# Patient Record
Sex: Male | Born: 1970 | Race: White | Hispanic: No | Marital: Single | Smoking: Never smoker
Health system: Southern US, Community
[De-identification: ages and names within clinical notes are randomized; demographics above are authoritative.]

## PROBLEM LIST (undated history)

## (undated) DIAGNOSIS — K219 Gastro-esophageal reflux disease without esophagitis: Secondary | ICD-10-CM

## (undated) DIAGNOSIS — K449 Diaphragmatic hernia without obstruction or gangrene: Secondary | ICD-10-CM

## (undated) DIAGNOSIS — K222 Esophageal obstruction: Secondary | ICD-10-CM

## (undated) DIAGNOSIS — F819 Developmental disorder of scholastic skills, unspecified: Secondary | ICD-10-CM

## (undated) HISTORY — DX: Esophageal obstruction: K22.2

## (undated) HISTORY — DX: Gastro-esophageal reflux disease without esophagitis: K21.9

## (undated) HISTORY — PX: DENTAL SURGERY: SHX609

## (undated) HISTORY — DX: Diaphragmatic hernia without obstruction or gangrene: K44.9

---

## 2017-06-08 ENCOUNTER — Emergency Department (HOSPITAL_COMMUNITY)
Admission: EM | Admit: 2017-06-08 | Discharge: 2017-06-08 | Disposition: A | Discharge status: Home / Self Care | Payer: BC Managed Care – PPO | Source: Home / Self Care | Attending: Emergency Medicine | Admitting: Emergency Medicine

## 2017-06-08 ENCOUNTER — Observation Stay (HOSPITAL_COMMUNITY): Payer: BC Managed Care – PPO

## 2017-06-08 ENCOUNTER — Encounter (HOSPITAL_COMMUNITY): Payer: Self-pay

## 2017-06-08 ENCOUNTER — Inpatient Hospital Stay (HOSPITAL_COMMUNITY)
Admission: EM | Admit: 2017-06-08 | Discharge: 2017-06-10 | DRG: 392 | Disposition: A | Discharge status: Home / Self Care | Payer: BC Managed Care – PPO | Attending: Internal Medicine | Admitting: Internal Medicine

## 2017-06-08 DIAGNOSIS — J069 Acute upper respiratory infection, unspecified: Secondary | ICD-10-CM | POA: Diagnosis present

## 2017-06-08 DIAGNOSIS — D62 Acute posthemorrhagic anemia: Secondary | ICD-10-CM

## 2017-06-08 DIAGNOSIS — K920 Hematemesis: Secondary | ICD-10-CM

## 2017-06-08 DIAGNOSIS — K3189 Other diseases of stomach and duodenum: Secondary | ICD-10-CM | POA: Diagnosis present

## 2017-06-08 DIAGNOSIS — K922 Gastrointestinal hemorrhage, unspecified: Secondary | ICD-10-CM | POA: Diagnosis present

## 2017-06-08 DIAGNOSIS — B348 Other viral infections of unspecified site: Secondary | ICD-10-CM

## 2017-06-08 DIAGNOSIS — K210 Gastro-esophageal reflux disease with esophagitis: Secondary | ICD-10-CM | POA: Diagnosis not present

## 2017-06-08 DIAGNOSIS — R059 Cough, unspecified: Secondary | ICD-10-CM

## 2017-06-08 DIAGNOSIS — R05 Cough: Secondary | ICD-10-CM

## 2017-06-08 DIAGNOSIS — R0989 Other specified symptoms and signs involving the circulatory and respiratory systems: Secondary | ICD-10-CM | POA: Insufficient documentation

## 2017-06-08 DIAGNOSIS — K222 Esophageal obstruction: Secondary | ICD-10-CM | POA: Diagnosis present

## 2017-06-08 DIAGNOSIS — B9789 Other viral agents as the cause of diseases classified elsewhere: Secondary | ICD-10-CM | POA: Diagnosis present

## 2017-06-08 DIAGNOSIS — K921 Melena: Secondary | ICD-10-CM | POA: Diagnosis present

## 2017-06-08 DIAGNOSIS — R55 Syncope and collapse: Secondary | ICD-10-CM | POA: Diagnosis present

## 2017-06-08 DIAGNOSIS — K449 Diaphragmatic hernia without obstruction or gangrene: Secondary | ICD-10-CM | POA: Diagnosis present

## 2017-06-08 DIAGNOSIS — D649 Anemia, unspecified: Secondary | ICD-10-CM | POA: Diagnosis not present

## 2017-06-08 DIAGNOSIS — F819 Developmental disorder of scholastic skills, unspecified: Secondary | ICD-10-CM | POA: Diagnosis present

## 2017-06-08 HISTORY — DX: Developmental disorder of scholastic skills, unspecified: F81.9

## 2017-06-08 LAB — TYPE AND SCREEN
ABO/RH(D): O POS
Antibody Screen: NEGATIVE

## 2017-06-08 LAB — CBC
HCT: 29.3 % — ABNORMAL LOW (ref 39.0–52.0)
HCT: 26.3 % — ABNORMAL LOW (ref 39.0–52.0)
HEMATOCRIT: 31 % — AB (ref 39.0–52.0)
HEMOGLOBIN: 10 g/dL — AB (ref 13.0–17.0)
HEMOGLOBIN: 8.8 g/dL — AB (ref 13.0–17.0)
Hemoglobin: 10.3 g/dL — ABNORMAL LOW (ref 13.0–17.0)
MCH: 31.4 pg (ref 26.0–34.0)
MCH: 30.4 pg (ref 26.0–34.0)
MCH: 30.4 pg (ref 26.0–34.0)
MCHC: 34.1 g/dL (ref 30.0–36.0)
MCHC: 33.2 g/dL (ref 30.0–36.0)
MCHC: 33.5 g/dL (ref 30.0–36.0)
MCV: 92.1 fL (ref 78.0–100.0)
MCV: 91.4 fL (ref 78.0–100.0)
MCV: 91 fL (ref 78.0–100.0)
PLATELETS: 224 10*3/uL (ref 150–400)
PLATELETS: 267 10*3/uL (ref 150–400)
PLATELETS: 287 10*3/uL (ref 150–400)
RBC: 3.18 MIL/uL — AB (ref 4.22–5.81)
RBC: 3.39 MIL/uL — ABNORMAL LOW (ref 4.22–5.81)
RBC: 2.89 MIL/uL — AB (ref 4.22–5.81)
RDW: 12.7 % (ref 11.5–15.5)
RDW: 12.5 % (ref 11.5–15.5)
RDW: 12.5 % (ref 11.5–15.5)
WBC: 12.9 10*3/uL — AB (ref 4.0–10.5)
WBC: 10.2 10*3/uL (ref 4.0–10.5)
WBC: 13.5 10*3/uL — ABNORMAL HIGH (ref 4.0–10.5)

## 2017-06-08 LAB — COMPREHENSIVE METABOLIC PANEL
ALT: 18 U/L (ref 17–63)
ALT: 18 U/L (ref 17–63)
AST: 19 U/L (ref 15–41)
AST: 20 U/L (ref 15–41)
Albumin: 2.9 g/dL — ABNORMAL LOW (ref 3.5–5.0)
Albumin: 3.4 g/dL — ABNORMAL LOW (ref 3.5–5.0)
Alkaline Phosphatase: 46 U/L (ref 38–126)
Alkaline Phosphatase: 42 U/L (ref 38–126)
Anion gap: 8 (ref 5–15)
Anion gap: 8 (ref 5–15)
BUN: 35 mg/dL — AB (ref 6–20)
BUN: 42 mg/dL — ABNORMAL HIGH (ref 6–20)
CHLORIDE: 109 mmol/L (ref 101–111)
CO2: 22 mmol/L (ref 22–32)
CO2: 21 mmol/L — ABNORMAL LOW (ref 22–32)
CREATININE: 0.86 mg/dL (ref 0.61–1.24)
CREATININE: 0.7 mg/dL (ref 0.61–1.24)
Calcium: 7.7 mg/dL — ABNORMAL LOW (ref 8.9–10.3)
Calcium: 8.4 mg/dL — ABNORMAL LOW (ref 8.9–10.3)
Chloride: 108 mmol/L (ref 101–111)
GFR calc Af Amer: >60 mL/min (ref >60)
Glucose, Bld: 126 mg/dL — ABNORMAL HIGH (ref 65–99)
Glucose, Bld: 125 mg/dL — ABNORMAL HIGH (ref 65–99)
POTASSIUM: 4 mmol/L (ref 3.5–5.1)
Potassium: 3.8 mmol/L (ref 3.5–5.1)
Sodium: 138 mmol/L (ref 135–145)
Sodium: 138 mmol/L (ref 135–145)
TOTAL PROTEIN: 4.7 g/dL — AB (ref 6.5–8.1)
TOTAL PROTEIN: 5.6 g/dL — AB (ref 6.5–8.1)
Total Bilirubin: 0.7 mg/dL (ref 0.3–1.2)
Total Bilirubin: 1.2 mg/dL (ref 0.3–1.2)

## 2017-06-08 LAB — CBG MONITORING, ED: Glucose-Capillary: 115 mg/dL — ABNORMAL HIGH (ref 65–99)

## 2017-06-08 LAB — URINALYSIS, ROUTINE W REFLEX MICROSCOPIC
BILIRUBIN URINE: NEGATIVE
Bacteria, UA: NONE SEEN
GLUCOSE, UA: NEGATIVE mg/dL
KETONES UR: 5 mg/dL — AB
LEUKOCYTES UA: NEGATIVE
Nitrite: NEGATIVE
PH: 7 (ref 5.0–8.0)
Protein, ur: NEGATIVE mg/dL
SQUAMOUS EPITHELIAL / LPF: NONE SEEN
Specific Gravity, Urine: 1.018 (ref 1.005–1.030)

## 2017-06-08 LAB — PROTIME-INR
INR: 1.15
PROTHROMBIN TIME: 14.6 s (ref 11.4–15.2)

## 2017-06-08 LAB — LIPASE, BLOOD: LIPASE: 35 U/L (ref 11–51)

## 2017-06-08 LAB — I-STAT TROPONIN, ED: TROPONIN I, POC: 0.01 ng/mL (ref 0.00–0.08)

## 2017-06-08 LAB — PROCALCITONIN: Procalcitonin: <0.1 ng/mL

## 2017-06-08 LAB — LACTIC ACID, PLASMA: LACTIC ACID, VENOUS: 0.7 mmol/L (ref 0.5–1.9)

## 2017-06-08 LAB — APTT: aPTT: 27 seconds (ref 24–36)

## 2017-06-08 LAB — POC OCCULT BLOOD, ED: Fecal Occult Bld: POSITIVE — AB

## 2017-06-08 LAB — ABO/RH: ABO/RH(D): O POS

## 2017-06-08 MED ORDER — ONDANSETRON HCL 4 MG PO TABS: 4.0000 mg | ORAL_TABLET | Freq: Four times a day (QID) | ORAL | Status: DC | PRN

## 2017-06-08 MED ORDER — ACETAMINOPHEN 325 MG PO TABS: 650.0000 mg | ORAL_TABLET | Freq: Four times a day (QID) | ORAL | Status: DC | PRN

## 2017-06-08 MED ORDER — ONDANSETRON HCL 4 MG/2ML IJ SOLN: 4.0000 mg | Freq: Four times a day (QID) | INTRAMUSCULAR | Status: DC | PRN

## 2017-06-08 MED ORDER — SODIUM CHLORIDE 0.9 % IV BOLUS (SEPSIS)
1000.0000 mL | Freq: Once | INTRAVENOUS | Status: AC
  Administered 2017-06-08: 1000 mL via INTRAVENOUS

## 2017-06-08 MED ORDER — PANTOPRAZOLE SODIUM 40 MG IV SOLR
8.0000 mg/h | INTRAVENOUS | Status: DC
  Administered 2017-06-08 – 2017-06-10 (×4): 8 mg/h via INTRAVENOUS
  Filled 2017-06-08 (×8): qty 80

## 2017-06-08 MED ORDER — PANTOPRAZOLE SODIUM 40 MG IV SOLR
40.0000 mg | Freq: Once | INTRAVENOUS | Status: AC
  Administered 2017-06-08: 40 mg via INTRAVENOUS
  Filled 2017-06-08: qty 40

## 2017-06-08 MED ORDER — ZOLPIDEM TARTRATE 5 MG PO TABS: 5.0000 mg | ORAL_TABLET | Freq: Every evening | ORAL | Status: DC | PRN

## 2017-06-08 MED ORDER — SODIUM CHLORIDE 0.9 % IV SOLN
1000.0000 mL | INTRAVENOUS | Status: DC
  Administered 2017-06-08 – 2017-06-09 (×2): 1000 mL via INTRAVENOUS

## 2017-06-08 MED ORDER — DM-GUAIFENESIN ER 30-600 MG PO TB12: 1.0000 | ORAL_TABLET | Freq: Two times a day (BID) | ORAL | Status: DC | PRN

## 2017-06-08 MED ORDER — ACETAMINOPHEN 650 MG RE SUPP: 650.0000 mg | Freq: Four times a day (QID) | RECTAL | Status: DC | PRN

## 2017-06-08 NOTE — ED Notes (Signed)
Pt.walked well with ambulating no dizziness .no assit just stand by

## 2017-06-08 NOTE — ED Triage Notes (Signed)
Pt arrived [facility] from home where he had an unwitnessed syncopal episode but per EMS family heard him and found him to be pal and diaphoretic. EMS states no change in orthostatics, he had a systolic pressure in the 90's initially with weak radial pulses. EMS administered 1500NS last pressure 106/70

## 2017-06-08 NOTE — ED Provider Notes (Signed)
[facility] Provider Note   CSN: [id number] Arrival date & time: 06/08/17  1611     History   Chief Complaint No chief complaint on file.   HPI Fernando Griffin is a 46 y.o. male who presents emergency department for chief complaint of hematemesis and melena. The patient seems to have some cognitive disability and most of the history is given by his mother with whom he lives however she does not offer his diagnosis to me. He was seen early this morning for an episode of syncope. He has been having cold and URI symptoms for about one week and was given a flu shot at which time his symptoms worsened. He has been taking a Tylenol Cold and flu every 4 hours during the day and night as directed. The patient was diagnosis morning with vasovagal syncope and given fluid rehydration. He went home and went to bed. Around 11 AM the patient states that he woke up and had very bad as belly cramping and pain followed by an episode of black tarry diarrhea. He went back to bed and around 1:30 PM woke up and asked his mother for some cereal with bananas. She states that she fed him when he is sitting in the recliner he suddenly became ill and started vomiting mucus mixed with black coffee ground emesis. He denies any current nausea or abdominal pain. He does not have a history of chronic abdominal pain.  HPI  History reviewed. No pertinent past medical history.  There are no active problems to display for this patient.   History reviewed. No pertinent surgical history.     Home Medications    Prior to Admission medications   Medication Sig Start Date End Date Taking? Authorizing Provider  Pseudoeph-Doxylamine-DM-APAP (TYLENOL COLD/FLU SEVERE NIGHT PO) Take 2 tablets by mouth at bedtime.   Yes [provider]    Family History No family history on file.  Social History Social History  Substance Use Topics  . Smoking status: Not on file  . Smokeless tobacco: Not on file  .  Alcohol use Not on file     Allergies   Patient has no known allergies.   Review of Systems Review of Systems  Ten systems reviewed and are negative for acute change, except as noted in the HPI.   Physical Exam Updated Vital Signs BP 130/75 (BP Location: Left Arm)   Pulse 97   Temp 98.3 F (36.8 C) (Oral)   Resp 18   Ht  (1.956 m)   SpO2 100%   BMI 18.97 kg/m   Physical Exam  Constitutional: He is oriented to person, place, and time. He appears well-developed and well-nourished. No distress.  HENT:  Head: Normocephalic and atraumatic.  Eyes: Conjunctivae are normal. No scleral icterus.  Neck: Normal range of motion. Neck supple.  Cardiovascular: Normal rate, regular rhythm and normal heart sounds.   Pulmonary/Chest: Effort normal and breath sounds normal. No respiratory distress.  Abdominal: Soft. He exhibits no distension. There is no tenderness.  Genitourinary:  Genitourinary Comments: Digital Rectal Exam Normal rectal tone, no masses or fissures Gluteal cleft covered and melanotic stool  Musculoskeletal: He exhibits no edema.  Neurological: He is alert and oriented to person, place, and time.  Skin: Skin is warm and dry. He is not diaphoretic.  Psychiatric: His behavior is normal.  Nursing note and vitals reviewed.    ED Treatments / Results  Labs (all labs ordered are listed, but only abnormal results are displayed)  Labs Reviewed  COMPREHENSIVE METABOLIC PANEL - Abnormal; Notable for the following:       Result Value   CO2 21 (*)    Glucose, Bld 125 (*)    BUN 42 (*)    Calcium 8.4 (*)    Total Protein 5.6 (*)    Albumin 3.4 (*)    All other components within normal limits  CBC - Abnormal; Notable for the following:    RBC 3.39 (*)    Hemoglobin 10.3 (*)    HCT 31.0 (*)    All other components within normal limits  POC OCCULT BLOOD, ED  TYPE AND SCREEN  ABO/RH    EKG  EKG Interpretation None       Radiology No results  found.  Procedures .Critical Care Performed by: Arthor Captain Authorized by: Arthor Captain   Critical care provider statement:    Critical care time (minutes):  40   Critical care was necessary to treat or prevent imminent or life-threatening deterioration of the following conditions:  Circulatory failure (gi bleed)   Critical care was time spent personally by me on the following activities:  Development of treatment plan with patient or surrogate, discussions with consultants, evaluation of patient's response to treatment, examination of patient, interpretation of cardiac output measurements, obtaining history from patient or surrogate, review of old charts, re-evaluation of patient's condition, pulse oximetry, ordering and review of radiographic studies, ordering and review of laboratory studies and ordering and performing treatments and interventions   (including critical care time)  Medications Ordered in ED Medications - No data to display   Initial Impression / Assessment and Plan / ED Course  I have reviewed the triage vital signs and the nursing notes.  Pertinent labs & imaging results that were available during my care of the patient were reviewed by me and considered in my medical decision making (see chart for details).  Clinical Course as of Jun 08 2045  Wed Jun 08, 2017  2038 Patient's vital signs worsening as his heart rate has become more elevated. His orthostatics have worsened as well and he is now orthostatic with a pulse that raises 30 points from lying to standing. Although his labs do not show significant drop in his hemoglobin and his BUN his elevated and I have concern for worsening bleed  [AH]    Clinical Course User Index [AH] Arthor Captain, PA-C   Patient with active GI bleed. He has some changes in his vital signs that suggest impending decompensation. Over the past 2 hours he has a change in his orthostatic vital signs and is now positive with a 30  point rise in heart rate with lying to standing. Although his hemoglobin has not dropped I have strong suspicion for his bleed given his melanotic stool. The patient does not have any active abdominal pain at this time. Patient started on protonic's drip and bolus with fluid resuscitation. He'll be admitted to stepdown unit by Dr. Clyde Lundborg.   Final Clinical Impressions(s) / ED Diagnoses   Final diagnoses:  Gastrointestinal hemorrhage, unspecified gastrointestinal hemorrhage type  Cough  Learning disability    New Prescriptions Current Discharge Medication List       Arthor Captain, PA-C 06/09/17 0055    Little, Ambrose Finland, MD 06/15/17 [phone]

## 2017-06-08 NOTE — Discharge Instructions (Signed)
You were seen today for upper respiratory symptoms and syncope. Your episode of syncope could be related to dehydration or medications. Your workup is reassuring. Hydrate aggressively at home. Continue supportive measures.

## 2017-06-08 NOTE — ED Provider Notes (Signed)
[facility] Provider Note   CSN: [id number] Arrival date & time: 06/08/17  4098     History   Chief Complaint Chief Complaint  Patient presents with  . Loss of Consciousness  . Hypotension  . Abdominal Pain    HPI Fernando Griffin is a 45 y.o. male.  HPI  This is a 46 year old male who presents with syncope. Patient reports recent history of upper respiratory symptoms including cough, runny nose, congestion. He states that his symptoms started after he got a flu shot. He is taking over-the-counter medication. He got up to go to the bathroom. He felt dizzy and diaphoretic. He was later found on the floor. Initial blood pressure 90 systolic. He received 1500 mL of normal saline by EMS. Currently he states he feels much better. No history of syncope. Denies chest pain or shortness of breath.  No past medical history on file.  There are no active problems to display for this patient.   No past surgical history on file.     Home Medications    Prior to Admission medications   Medication Sig Start Date End Date Taking? Authorizing Provider  Pseudoeph-Doxylamine-DM-APAP (TYLENOL COLD/FLU SEVERE NIGHT PO) Take 2 tablets by mouth at bedtime.   Yes [provider]    Family History No family history on file.  Social History Social History  Substance Use Topics  . Smoking status: Not on file  . Smokeless tobacco: Not on file  . Alcohol use Not on file     Allergies   Patient has no known allergies.   Review of Systems Review of Systems  Constitutional: Negative for fever.  HENT: Positive for congestion and sinus pressure.   Respiratory: Positive for cough. Negative for shortness of breath.   Cardiovascular: Negative for chest pain.  Gastrointestinal: Negative for abdominal pain.  Skin: Negative for rash.  Neurological: Positive for dizziness, syncope and light-headedness. Negative for headaches.  All other systems reviewed and are  negative.    Physical Exam Updated Vital Signs BP 106/77   Pulse 84   Temp 98 F (36.7 C) (Oral)   Resp 13   Ht  (1.956 m)   Wt 72.6 kg (160 lb)   SpO2 99%   BMI 18.97 kg/m   Physical Exam  Constitutional: He is oriented to person, place, and time. He appears well-developed and well-nourished. No distress.  HENT:  Head: Normocephalic and atraumatic.  Mouth/Throat: Oropharynx is clear and moist.  Eyes: Pupils are equal, round, and reactive to light.  Cardiovascular: Normal rate, regular rhythm and normal heart sounds.   No murmur heard. Pulmonary/Chest: Effort normal and breath sounds normal. No respiratory distress. He has no wheezes.  Abdominal: Soft. Bowel sounds are normal. There is no tenderness. There is no rebound.  Musculoskeletal: He exhibits no edema.  Neurological: He is alert and oriented to person, place, and time.  Cranial nerves II through XII intact, 5 out of 5 strength in all 4 extremity is, no dysmetria to finger-nose-finger  Skin: Skin is warm and dry.  Psychiatric: He has a normal mood and affect.  Nursing note and vitals reviewed.    ED Treatments / Results  Labs (all labs ordered are listed, but only abnormal results are displayed) Labs Reviewed  CBC - Abnormal; Notable for the following:       Result Value   WBC 12.9 (*)    RBC 3.18 (*)    Hemoglobin 10.0 (*)    HCT 29.3 (*)  All other components within normal limits  URINALYSIS, ROUTINE W REFLEX MICROSCOPIC - Abnormal; Notable for the following:    Color, Urine STRAW (*)    Hgb urine dipstick SMALL (*)    Ketones, ur 5 (*)    All other components within normal limits  COMPREHENSIVE METABOLIC PANEL - Abnormal; Notable for the following:    Glucose, Bld 126 (*)    BUN 35 (*)    Calcium 7.7 (*)    Total Protein 4.7 (*)    Albumin 2.9 (*)    All other components within normal limits  CBG MONITORING, ED - Abnormal; Notable for the following:    Glucose-Capillary 115 (*)    All  other components within normal limits  LIPASE, BLOOD  I-STAT TROPONIN, ED    EKG  EKG Interpretation None      ED ECG REPORT   Date: 06/08/2017  Rate: 78  Rhythm: normal sinus rhythm  QRS Axis: normal  Intervals: normal  ST/T Wave abnormalities: normal  Conduction Disutrbances:none  Narrative Interpretation:   Old EKG Reviewed: none available  I have personally reviewed the EKG tracing and agree with the computerized printout as noted.  Radiology No results found.  Procedures Procedures (including critical care time)  Medications Ordered in ED Medications  sodium chloride 0.9 % bolus 1,000 mL (1,000 mLs Intravenous New Bag/Given 06/08/17 0431)     Initial Impression / Assessment and Plan / ED Course  I have reviewed the triage vital signs and the nursing notes.  Pertinent labs & imaging results that were available during my care of the patient were reviewed by me and considered in my medical decision making (see chart for details).     Patient presents with upper respiratory symptoms. Reports episode of syncope. He is nontoxic on exam. Vital signs reassuring. He is neurologically intact.EKG without evidence of arrhythmia. Patient did have a prodrome. Suspect vasovagal etiology. Patient may have dehydration. He is also taking over-the-counter cold medications which could affect this. A sick labwork obtained and largely reassuring. He is not orthostatic here but has received over a liter of fluids prior to evaluation. He is remained nontoxic and vital signs reassuring. He feels much better and ambulates independently without difficulty.  After history, exam, and medical workup I feel the patient has been appropriately medically screened and is safe for discharge home. Pertinent diagnoses were discussed with the patient. Patient was given return precautions.   Final Clinical Impressions(s) / ED Diagnoses   Final diagnoses:  Vasovagal syncope  URI, acute    New  Prescriptions New Prescriptions   No medications on file     Shon Baton, MD 06/08/17 0710

## 2017-06-08 NOTE — ED Triage Notes (Signed)
Patient complains of dark stools x 4 and dark emesis since leaving ED this am following syncopal event. Patient pale on arrival, alert and oriented. Denies abdominal pain.

## 2017-06-08 NOTE — H&P (Signed)
History and Physical    Fernando Griffin ZOX:[id number] DOB: 10-09-1970 DOA: 06/08/2017  Referring MD/NP/PA:   PCP: Kaleen Mask, MD   Patient coming from:  The patient is coming from home.  At baseline, pt is independent for most of ADL.   Chief Complaint: Syncope, abdominal pain, hematemesis and tarry stool, URI symptoms  HPI: Fernando Griffin is a 46 y.o. male with medical history significant of learning disability, who presents with syncope, abdominal pain, hematemesis and tarry stool and URI symptoms  Per his mother, who didn't think patient has been having some upper respiratory infection symptoms recently, including cough, runny nose, nasal congestion, but no chest pain or shortness of breath. No fever or chills. Patient had flu shot on Monday, which seems to have made symptoms worse. Pt is taking tylenol/flu pills. His mother states that pt had a syncope episode in the early morning at about 2 AM. Patient passed out and was found to be on the floor. Pt was seen in ED in AM, and had negative workup, and was diagnosed as possible vasovagal syncope. Pt was discharged home in stable condition after treated with IV fluid.  Pt went home and went to bed. Around 11 AM, the patient had one episode of crampy abdominal pain, followed by an episode of f black tarry diarrhea. He went back to bed again. Around 1:30 PM, pt woke up and asked his mother for some cereal with bananas. She states that she fed him when he is sitting in the recliner, he suddenly became ill and started vomiting mucus mixed with black materials. Currently patient does not have a nausea, vomiting or abdominal pain. Patient does not have chest pain, dizziness or lightheadedness. Denies symptoms of UTI. No unilateral weakness, numbness or tingling in his extremities.   ED Course: pt was found to have positive orthostatic vital signs in ED, hemoglobin 10.0-->8.8 (was 10.0 in this morning). WBC 12.9, urinalysis negative, electrolytes  renal function okay, temperature normal, tachycardia, oxygen saturation 99% on room air. Patient is placed on telemetry bed observation.  Review of Systems:   General: no fevers, chills, no body weight gain, has poor appetite, has fatigue HEENT: no blurry vision, hearing changes or sore throat Respiratory: no dyspnea, has coughing, no wheezing CV: no chest pain, no palpitations GI: has nausea, vomiting, abdominal pain, diarrhea, and tarry stool and hematemesis GU: no dysuria, burning on urination, increased urinary frequency, hematuria  Ext: no leg edema Neuro: no unilateral weakness, numbness, or tingling, no vision change or hearing loss Skin: no rash, no skin tear. MSK: No muscle spasm, no deformity, no limitation of range of movement in spin Heme: No easy bruising.  Travel history: No recent long distant travel.  Allergy: No Known Allergies  Past Medical History:  Diagnosis Date  . Learning disability     Past Surgical History:  Procedure Laterality Date  . DENTAL SURGERY      Social History:  reports that he has never smoked. He does not have any smokeless tobacco history on file. He reports that he does not drink alcohol or use drugs.  Family History:  Family History  Problem Relation Age of Onset  . Brain cancer Father      Prior to Admission medications   Medication Sig Start Date End Date Taking? Authorizing Provider  Pseudoeph-Doxylamine-DM-APAP (TYLENOL COLD/FLU SEVERE NIGHT PO) Take 2 tablets by mouth at bedtime.   Yes [provider]    Physical Exam: Vitals:   06/08/17 1900 06/08/17  1930 06/08/17 2000 06/08/17 2030  BP: 128/82 125/77 (!) 117/95 127/80  Pulse: 97 (!) 107 (!) 118 99  Resp: Temp:      TempSrc:      SpO2: 100% 99% 100% 100%  Height:       General: Not in acute distress HEENT:       Eyes: PERRL, EOMI, no scleral icterus.       ENT: No discharge from the ears and nose, no pharynx injection, no tonsillar  enlargement.        Neck: No JVD, no bruit, no mass felt. Heme: No neck lymph node enlargement. Cardiac: S1/S2, RRR, No murmurs, No gallops or rubs. Respiratory: No rales, wheezing, rhonchi or rubs. GI: Soft, nondistended, nontender, no rebound pain, no organomegaly, BS present. GU: No hematuria Ext: No pitting leg edema bilaterally. 2+DP/PT pulse bilaterally. Musculoskeletal: No joint deformities, No joint redness or warmth, no limitation of ROM in spin. Skin: No rashes.  Neuro: Alert, oriented X3, cranial nerves II-XII grossly intact, moves all extremities normally. Muscle strength 5/5 in all extremities, sensation to light touch intact. Brachial reflex 2+ bilaterally. Knee reflex 1+ bilaterally. Negative Babinski's sign.  Psych: Patient is not psychotic, no suicidal or hemocidal ideation.  Labs on Admission: I have personally reviewed following labs and imaging studies  CBC:  Recent Labs Lab 06/08/17 0416 06/08/17 1640  WBC 12.9* 10.2  HGB 10.0* 10.3*  HCT 29.3* 31.0*  MCV 92.1 91.4  PLT 224 267   Basic Metabolic Panel:  Recent Labs Lab 06/08/17 0416 06/08/17 1640  NA 138 138  K 3.8 4.0  CL 108 109  CO2 22 21*  GLUCOSE 126* 125*  BUN 35* 42*  CREATININE 0.86 0.70  CALCIUM 7.7* 8.4*   GFR: Estimated Creatinine Clearance: 117.2 mL/min (by C-G formula based on SCr of 0.7 mg/dL). Liver Function Tests:  Recent Labs Lab 06/08/17 0416 06/08/17 1640  AST 19 20  ALT 18 18  ALKPHOS 46 42  BILITOT 0.7 1.2  PROT 4.7* 5.6*  ALBUMIN 2.9* 3.4*    Recent Labs Lab 06/08/17 0416  LIPASE 35   No results for input(s): AMMONIA in the last 168 hours. Coagulation Profile: No results for input(s): INR, PROTIME in the last 168 hours. Cardiac Enzymes: No results for input(s): CKTOTAL, CKMB, CKMBINDEX, TROPONINI in the last 168 hours. BNP (last 3 results) No results for input(s): PROBNP in the last 8760 hours. HbA1C: No results for input(s): HGBA1C in the last 72  hours. CBG:  Recent Labs Lab 06/08/17 0415  GLUCAP 115*   Lipid Profile: No results for input(s): CHOL, HDL, LDLCALC, TRIG, CHOLHDL, LDLDIRECT in the last 72 hours. Thyroid Function Tests: No results for input(s): TSH, T4TOTAL, FREET4, T3FREE, THYROIDAB in the last 72 hours. Anemia Panel: No results for input(s): VITAMINB12, FOLATE, FERRITIN, TIBC, IRON, RETICCTPCT in the last 72 hours. Urine analysis:    Component Value Date/Time   COLORURINE STRAW (A) 06/08/2017 0600   APPEARANCEUR CLEAR 06/08/2017 0600   LABSPEC 1.018 06/08/2017 0600   PHURINE 7.0 06/08/2017 0600   GLUCOSEU NEGATIVE 06/08/2017 0600   HGBUR SMALL (A) 06/08/2017 0600   BILIRUBINUR NEGATIVE 06/08/2017 0600   KETONESUR 5 (A) 06/08/2017 0600   PROTEINUR NEGATIVE 06/08/2017 0600   NITRITE NEGATIVE 06/08/2017 0600   LEUKOCYTESUR NEGATIVE 06/08/2017 0600   Sepsis Labs: (procalcitonin:4,lacticidven:4) )No results found for this or any previous visit (from the past 240 hour(s)). Dictation #1 [id number]  [id number]   Radiological  Exams on Admission: No results found.   EKG:   Not done in ED, will get one.   Assessment/Plan Principal Problem:   GIB (gastrointestinal bleeding) Active Problems:   Syncope   Normocytic anemia   URI (upper respiratory infection)   Learning disability   GIB (gastrointestinal bleeding): Likely due to upper GIB. No history of GIB. Hemoglobin dropped from 10.0-->8.8. Has tachycardia, but hemodynamically stable currently. No hx of EGD or colonoscope.  - will place on tele bed for obs - NPO for possible EGD - IVF: 2L NS bolus, then at 125 mL/hr - Start IV pantoprazole gtt - Zofran IV for nausea - Avoid NSAIDs and SQ heparin - 2 large bore IVs - Monitor closely and follow q6h cbc, transfuse as necessary for hgb<7.0 - LaB: INR, PTT and type screen - please call GI in AM  Syncope: Patient has positive orthostatic vital signs on admission, which is likely the  reason. -IVF as above -get CT-head to r/o other intracranial abnormalities. -PT/OT  Normocytic anemia due to GIB: -f/u cbc and transfuse as needed for Hgb<7.0  URI (upper respiratory infection): likely due to viral infection. -When necessary Mucinex for cough -Follow-up flu PCR and respiratory virus panel    DVT ppx: SCD Code Status: Full code Family Communication:  Yes, patient's mother at bed side Disposition Plan:  Anticipate discharge back to previous home environment Consults called:  none Admission status: Obs / tele    Date of Service 06/08/2017    Lorretta Harp [facility] Pager [phone]  If 7PM-7AM, please contact night-coverage [email or web address] Password [email or web address] 06/08/2017, 9:17 PM

## 2017-06-09 ENCOUNTER — Observation Stay (HOSPITAL_COMMUNITY): Payer: BC Managed Care – PPO | Admitting: Anesthesiology

## 2017-06-09 ENCOUNTER — Encounter (HOSPITAL_COMMUNITY)
Admission: EM | Disposition: A | Discharge status: Home / Self Care | Payer: Self-pay | Source: Home / Self Care | Attending: Internal Medicine

## 2017-06-09 ENCOUNTER — Encounter (HOSPITAL_COMMUNITY): Payer: Self-pay | Admitting: Certified Registered Nurse Anesthetist

## 2017-06-09 DIAGNOSIS — R55 Syncope and collapse: Secondary | ICD-10-CM

## 2017-06-09 DIAGNOSIS — K222 Esophageal obstruction: Secondary | ICD-10-CM | POA: Diagnosis present

## 2017-06-09 DIAGNOSIS — K449 Diaphragmatic hernia without obstruction or gangrene: Secondary | ICD-10-CM

## 2017-06-09 DIAGNOSIS — B348 Other viral infections of unspecified site: Secondary | ICD-10-CM | POA: Diagnosis not present

## 2017-06-09 DIAGNOSIS — J069 Acute upper respiratory infection, unspecified: Secondary | ICD-10-CM | POA: Diagnosis present

## 2017-06-09 DIAGNOSIS — K3189 Other diseases of stomach and duodenum: Secondary | ICD-10-CM | POA: Diagnosis present

## 2017-06-09 DIAGNOSIS — K921 Melena: Secondary | ICD-10-CM

## 2017-06-09 DIAGNOSIS — B9789 Other viral agents as the cause of diseases classified elsewhere: Secondary | ICD-10-CM | POA: Diagnosis present

## 2017-06-09 DIAGNOSIS — K920 Hematemesis: Secondary | ICD-10-CM | POA: Diagnosis present

## 2017-06-09 DIAGNOSIS — R05 Cough: Secondary | ICD-10-CM | POA: Diagnosis present

## 2017-06-09 DIAGNOSIS — K210 Gastro-esophageal reflux disease with esophagitis: Secondary | ICD-10-CM | POA: Diagnosis present

## 2017-06-09 DIAGNOSIS — D62 Acute posthemorrhagic anemia: Secondary | ICD-10-CM | POA: Diagnosis present

## 2017-06-09 DIAGNOSIS — F819 Developmental disorder of scholastic skills, unspecified: Secondary | ICD-10-CM | POA: Diagnosis present

## 2017-06-09 DIAGNOSIS — K922 Gastrointestinal hemorrhage, unspecified: Secondary | ICD-10-CM | POA: Diagnosis not present

## 2017-06-09 HISTORY — PX: ESOPHAGOGASTRODUODENOSCOPY: SHX5428

## 2017-06-09 LAB — RESPIRATORY PANEL BY PCR
Adenovirus: NOT DETECTED
BORDETELLA PERTUSSIS-RVPCR: NOT DETECTED
CHLAMYDOPHILA PNEUMONIAE-RVPPCR: NOT DETECTED
CORONAVIRUS 229E-RVPPCR: NOT DETECTED
CORONAVIRUS HKU1-RVPPCR: NOT DETECTED
Coronavirus NL63: NOT DETECTED
Coronavirus OC43: NOT DETECTED
INFLUENZA B-RVPPCR: NOT DETECTED
Influenza A: NOT DETECTED
MYCOPLASMA PNEUMONIAE-RVPPCR: NOT DETECTED
Metapneumovirus: NOT DETECTED
Parainfluenza Virus 1: NOT DETECTED
Parainfluenza Virus 2: NOT DETECTED
Parainfluenza Virus 3: NOT DETECTED
Parainfluenza Virus 4: NOT DETECTED
Respiratory Syncytial Virus: NOT DETECTED
Rhinovirus / Enterovirus: DETECTED — AB

## 2017-06-09 LAB — BASIC METABOLIC PANEL
Anion gap: 4 — ABNORMAL LOW (ref 5–15)
BUN: 19 mg/dL (ref 6–20)
CHLORIDE: 112 mmol/L — AB (ref 101–111)
CO2: 22 mmol/L (ref 22–32)
CREATININE: 0.76 mg/dL (ref 0.61–1.24)
Calcium: 7.9 mg/dL — ABNORMAL LOW (ref 8.9–10.3)
GFR calc non Af Amer: >60 mL/min (ref >60)
GLUCOSE: 100 mg/dL — AB (ref 65–99)
Potassium: 3.6 mmol/L (ref 3.5–5.1)
Sodium: 138 mmol/L (ref 135–145)

## 2017-06-09 LAB — CBC
HCT: 25.9 % — ABNORMAL LOW (ref 39.0–52.0)
HEMATOCRIT: 24.9 % — AB (ref 39.0–52.0)
HEMOGLOBIN: 8.5 g/dL — AB (ref 13.0–17.0)
Hemoglobin: 8.8 g/dL — ABNORMAL LOW (ref 13.0–17.0)
MCH: 31 pg (ref 26.0–34.0)
MCH: 30.9 pg (ref 26.0–34.0)
MCHC: 34.1 g/dL (ref 30.0–36.0)
MCHC: 34 g/dL (ref 30.0–36.0)
MCV: 90.9 fL (ref 78.0–100.0)
MCV: 90.9 fL (ref 78.0–100.0)
PLATELETS: 222 10*3/uL (ref 150–400)
Platelets: 211 10*3/uL (ref 150–400)
RBC: 2.74 MIL/uL — ABNORMAL LOW (ref 4.22–5.81)
RBC: 2.85 MIL/uL — ABNORMAL LOW (ref 4.22–5.81)
RDW: 12.8 % (ref 11.5–15.5)
RDW: 12.6 % (ref 11.5–15.5)
WBC: 8.9 10*3/uL (ref 4.0–10.5)
WBC: 9.4 10*3/uL (ref 4.0–10.5)

## 2017-06-09 LAB — INFLUENZA PANEL BY PCR (TYPE A & B)
INFLAPCR: NEGATIVE
Influenza B By PCR: NEGATIVE

## 2017-06-09 LAB — LACTIC ACID, PLASMA: LACTIC ACID, VENOUS: 0.7 mmol/L (ref 0.5–1.9)

## 2017-06-09 LAB — HIV ANTIBODY (ROUTINE TESTING W REFLEX): HIV Screen 4th Generation wRfx: NONREACTIVE

## 2017-06-09 SURGERY — EGD (ESOPHAGOGASTRODUODENOSCOPY)
Anesthesia: Monitor Anesthesia Care

## 2017-06-09 MED ORDER — PROPOFOL 10 MG/ML IV BOLUS
INTRAVENOUS | Status: DC | PRN
  Administered 2017-06-09 (×4): 20 mg via INTRAVENOUS

## 2017-06-09 MED ORDER — LACTATED RINGERS IV SOLN
INTRAVENOUS | Status: AC | PRN
  Administered 2017-06-09: 1000 mL via INTRAVENOUS
  Administered 2017-06-09: 13:00:00 via INTRAVENOUS

## 2017-06-09 MED ORDER — PHENOL 1.4 % MT LIQD
1.0000 | OROMUCOSAL | Status: DC | PRN
  Administered 2017-06-09: 1 via OROMUCOSAL
  Filled 2017-06-09: qty 177

## 2017-06-09 MED ORDER — PROPOFOL 500 MG/50ML IV EMUL
INTRAVENOUS | Status: DC | PRN
  Administered 2017-06-09: 100 ug/kg/min via INTRAVENOUS

## 2017-06-09 NOTE — Progress Notes (Signed)
**Note Fernando-Identified via Obfuscation** PT Cancellation Note  Patient Details Name: Fernando Griffin MRN: [id number] DOB: April 02, 1971   Cancelled Treatment:    Reason Eval/Treat Not Completed: PT screened, no needs identified, will sign off; patient back from procedure and sitting EOB eating italian ice with family in the room.  Reports he feels his mobility is at his baseline and no current need for PT.  Will sign off.    Elray Mcgregor 06/09/2017, 3:42 PM  Sheran Lawless, [place] [phone] 06/09/2017

## 2017-06-09 NOTE — Op Note (Signed)
[facility] Patient Name: Fernando Griffin Procedure Date : 06/09/2017 MRN: [id number] Attending MD: Ladene Artist , MD Date of Birth: 1970/12/31 CSN: [id number] Age: 46 Admit Type: Inpatient Procedure:                Upper GI endoscopy Indications:              Hematemesis, Melena Providers:                Pricilla Riffle. Fuller Plan, MD, Kingsley Plan, RN, Cletis Athens, Technician, Rejeana Brock, CRNA Referring MD:             [facility] Medicines:                Monitored Anesthesia Care Complications:            No immediate complications. Estimated Blood Loss:     Estimated blood loss: none. Procedure:                Pre-Anesthesia Assessment:                           - Prior to the procedure, a History and Physical                            was performed, and patient medications and                            allergies were reviewed. The patient's tolerance of                            previous anesthesia was also reviewed. The risks                            and benefits of the procedure and the sedation                            options and risks were discussed with the patient.                            All questions were answered, and informed consent                            was obtained. Prior Anticoagulants: The patient has                            taken no previous anticoagulant or antiplatelet                            agents. ASA Grade Assessment: II - A patient with                            mild systemic disease. After reviewing the risks  and benefits, the patient was deemed in                            satisfactory condition to undergo the procedure.                           After obtaining informed consent, the endoscope was                            passed under direct vision. Throughout the                            procedure, the patient's blood pressure, pulse, and           oxygen saturations were monitored continuously. The                            [id number] ([id number]) scope was introduced through the                            mouth, and advanced to the second part of duodenum.                            The upper GI endoscopy was accomplished without                            difficulty. The patient tolerated the procedure                            well. Scope In: Scope Out: Findings:      LA Grade C (one or more mucosal breaks continuous between tops of 2 or       more mucosal folds, less than 75% circumference) esophagitis with       friable mucosa was found in the distal esophagus.      One mild benign-appearing, intrinsic stenosis was found at the       gastroesophageal junction. This measured 1.4 cm (inner diameter) and was       traversed.      The exam of the esophagus was otherwise normal.      A medium-sized hiatal hernia was present.      The exam of the stomach was otherwise normal.      Localized mildly erythematous mucosa without active bleeding and with no       stigmata of bleeding was found in the duodenal bulb.      The second portion of the duodenum was normal. Impression:               - LA Grade C reflux esophagitis with friable mucosa.                           - Benign-appearing esophageal stenosis.                           - Medium-sized hiatal hernia.                           -  Erythematous duodenopathy.                           - Normal second portion of the duodenum.                           - No specimens collected. Moderate Sedation:      none/MAC Recommendation:           - Return patient to hospital ward for ongoing care.                           - Clear liquid diet today.                           - Continue IV pantoprazole infusion overnight. If                            stable tomorrow change to pantoprazole 40 mg po bid                            for 1 month and then 40 mg po qam long term.                            - Antireflux measures long term.                           - Avoid ASA/NSAID products for 1 month.                           - GI office follow up in 1 month.                           - Continue present medications. Procedure Code(s):        --- Professional ---                           [id number], Esophagogastroduodenoscopy, flexible,                            transoral; diagnostic, including collection of                            specimen(s) by brushing or washing, when performed                            (separate procedure) Diagnosis Code(s):        --- Professional ---                           K21.0, Gastro-esophageal reflux disease with                            esophagitis                           K22.2, Esophageal obstruction  K44.9, Diaphragmatic hernia without obstruction or                            gangrene                           K31.89, Other diseases of stomach and duodenum                           K92.0, Hematemesis                           K92.1, Melena (includes Hematochezia) CPT copyright 2016 American Medical Association. All rights reserved. The codes documented in this report are preliminary and upon coder review may  be revised to meet current compliance requirements. Ladene Artist, MD 06/09/2017 1:46:17 PM This report has been signed electronically. Number of Addenda: 0

## 2017-06-09 NOTE — H&P (View-Only) (Signed)
Referring Provider: [facility]   Primary Care Physician:  Kaleen Mask, MD Primary Gastroenterologist:   Gentry Fitz  Reason for Consultation:  GI bleed    Attending physician's note   I have taken a history, examined the patient and reviewed the chart. I agree with the Advanced Practitioner's note, impression and recommendations.  Acute UGI bleed with hematemesis, melena, syncope and ABL anemia.  IV PPI infusion for now. Trend CBC. Avoid ASA/NSAIDs. EGD today.   Fernando Head, MD Clementeen Graham [phone] Mon-Fri 8a-5p [phone] after 5p, weekends, holidays   ASSESSMENT AND PLAN:    1.  46 yo relatively healthy male with upper GI bleeding and symptomatic anemia.  Recent use of Alka Seltzer (contains ASA). Rule out PUD. -EGD today. The risks and benefits of EGD were discussed and the patient agrees to proceed.  -trend hgb, currently stable at 8.8, down from 10 on admission.  -continue PPI gtt.   2.  Learning disability.  HPI: Fernando Griffin is a 46 y.o. male brought to hospital by EMS after unwitnessed syncopal episode at home. Family found him diaphoretic, pale. Family then witnessed episode of hematemesis and melena. EMS gave him IVF, BP improved. Initially hgb 10, now at 8.8. BUN was up at 42. He has no hx of PUD. Recently had cold symptoms and took alka seltzer cold. Other than that he took tylenol. [name] has no GI history. He has no significant medical history other than a learning disability. His weight has been stable. BMs normal. Currently he has no SOB, chest pain or dizziness.   Past Medical History:  Diagnosis Date  . Learning disability     Past Surgical History:  Procedure Laterality Date  . DENTAL SURGERY      Prior to Admission medications   Medication Sig Start Date End Date Taking? Authorizing Provider  Pseudoeph-Doxylamine-DM-APAP (TYLENOL COLD/FLU SEVERE NIGHT PO) Take 2 tablets by mouth at bedtime.   Yes [provider]    Current  Facility-Administered Medications  Medication Dose Route Frequency Provider Last Rate Last Dose  . 0.9 %  sodium chloride infusion  1,000 mL Intravenous Continuous Arthor Captain, PA-C 125 mL/hr at 06/09/17 0627 1,000 mL at 06/09/17 0627  . acetaminophen (TYLENOL) tablet 650 mg  650 mg Oral Q6H PRN Lorretta Harp, MD       Or  . acetaminophen (TYLENOL) suppository 650 mg  650 mg Rectal Q6H PRN Lorretta Harp, MD      . dextromethorphan-guaiFENesin (MUCINEX DM) 30-600 MG per 12 hr tablet 1 tablet  1 tablet Oral BID PRN Lorretta Harp, MD      . ondansetron [facility]) tablet 4 mg  4 mg Oral Q6H PRN Lorretta Harp, MD       Or  . ondansetron [facility]) injection 4 mg  4 mg Intravenous Q6H PRN Lorretta Harp, MD      . pantoprazole (PROTONIX) 80 mg in sodium chloride 0.9 % 250 mL (0.32 mg/mL) infusion  8 mg/hr Intravenous Continuous Arthor Captain, PA-C 25 mL/hr at 06/09/17 0628 8 mg/hr at 06/09/17 6213  . zolpidem (AMBIEN) tablet 5 mg  5 mg Oral QHS PRN Lorretta Harp, MD        Allergies as of 06/08/2017  . (No Known Allergies)    Family History  Problem Relation Age of Onset  . Brain cancer Father     Social History   Social History  . Marital status: Single    Spouse name: N/A  . Number of children: N/A  .  Years of education: N/A   Occupational History  . Not on file.   Social History Main Topics  . Smoking status: Never Smoker  . Smokeless tobacco: Not on file  . Alcohol use No  . Drug use: No  . Sexual activity: Not on file   Other Topics Concern  . Not on file   Social History Narrative  . No narrative on file    Review of Systems: All systems reviewed and negative except where noted in HPI.  Physical Exam: Vital signs in last 24 hours: Temp:  [98.3 F (36.8 C)-99.1 F (37.3 C)] 98.6 F (37 C) (10/04 0811) Pulse Rate:  [83-118] 84 (10/04 0811) Resp:  [13-20] 14 (10/04 0811) BP: (104-130)/(53-95) 113/61 (10/04 0811) SpO2:  [99 %-100 %] 100 % (10/04 0811) Last BM Date:  06/08/17 General:   Alert, well-developed,  White male in NAD Psych:  Pleasant, cooperative. Normal mood and affect. Eyes:  Pupils equal, sclera clear, no icterus.   Conjunctiva pale. Ears:  Normal auditory acuity. Nose:  No deformity, discharge,  or lesions. Neck:  Supple; no masses Lungs:  Clear throughout to auscultation.   No wheezes, crackles, or rhonchi.  Heart:  Regular rate and rhythm; no murmurs, no edema Abdomen:  Soft, non-distended, nontender, BS active, no palp mass    Msk:  Symmetrical without gross deformities. . Neurologic:  Alert and  oriented x4;  grossly normal neurologically. Skin:  Intact without significant lesions or rashes..   Intake/Output from previous day: 10/03 0701 - 10/04 0700 In: 2952.5 [I.V.:952.5; IV Piggyback:2000] Out: -  Intake/Output this shift: Total I/O In: -  Out: 400 [Urine:400]  Lab Results:  Recent Labs  06/08/17 2119 06/09/17 0403 06/09/17 0818  WBC 13.5* 8.9 9.4  HGB 8.8* 8.5* 8.8*  HCT 26.3* 24.9* 25.9*  PLT 287 211 222   BMET  Recent Labs  06/08/17 0416 06/08/17 1640 06/09/17 0818  NA 138 138 138  K 3.8 4.0 3.6  CL 108 109 112*  CO2 22 21* 22  GLUCOSE 126* 125* 100*  BUN 35* 42* 19  CREATININE 0.86 0.70 0.76  CALCIUM 7.7* 8.4* 7.9*   LFT  Recent Labs  06/08/17 1640  PROT 5.6*  ALBUMIN 3.4*  AST 20  ALT 18  ALKPHOS 42  BILITOT 1.2   PT/INR  Recent Labs  06/08/17 2006  LABPROT 14.6  INR 1.15    Studies/Results: Dg Chest 2 View  Result Date: 06/08/2017 CLINICAL DATA:  Cough , syncope.  Symptoms began after flu shot. EXAM: CHEST  2 VIEW COMPARISON:  None. FINDINGS: Cardiomediastinal silhouette is normal. Trace calcific atherosclerosis aortic arch. Mild bronchitic changes. No pleural effusions or focal consolidations. Biapical pleural thickening. Trachea projects midline and there is no pneumothorax. Soft tissue planes and included osseous structures are non-suspicious. IMPRESSION: Mild bronchitic  changes without focal consolidation. Aortic Atherosclerosis (ICD10-I70.0). Electronically Signed   By: Awilda Metro M.D.   On: 06/08/2017 22:06   Ct Griffin Wo Contrast  Result Date: 06/08/2017 CLINICAL DATA:  Syncope EXAM: CT Griffin WITHOUT CONTRAST TECHNIQUE: Contiguous axial images were obtained from the base of the skull through the vertex without intravenous contrast. COMPARISON:  None. FINDINGS: Brain: No mass lesion, intraparenchymal hemorrhage or extra-axial collection. No evidence of acute cortical infarct. Brain parenchyma and CSF-containing spaces are normal for age. Vascular: No hyperdense vessel or unexpected calcification. Skull: Normal visualized skull base, calvarium and extracranial soft tissues. Sinuses/Orbits: No sinus fluid levels or advanced mucosal thickening. No mastoid  effusion. Normal orbits. IMPRESSION: Normal Griffin CT. Electronically Signed   By: Deatra Robinson M.D.   On: 06/08/2017 21:49    Willette Cluster, NP-C @  06/09/2017, 11:09 AM  Pager number [phone]

## 2017-06-09 NOTE — Progress Notes (Signed)
Fernando Griffin,Fernando Griffin 46 yrs old male patient received from ED. Patient is alert oriented x4. Vital signs are stable. Skin assessment done with another nurse found abrasion on right leg. Patient is on droplet precaution. Patient is on NPO and can take sips with meds. Iv in place running normal saline and Protonix.patient verbalize the understanding of call bell and phone. Bed in low position and call light in reach.Marland Kitchen

## 2017-06-09 NOTE — Progress Notes (Signed)
PT Cancellation Note  Patient Details Name: Fernando Griffin MRN: [id number] DOB: 1970/10/17   Cancelled Treatment:    Reason Eval/Treat Not Completed: Patient at procedure or test/unavailable; patient down for EGD.  Spoke with OT who feels pt close to baseline.  Will attempt to speak with pt for possible screening when he is available.   Elray Mcgregor 06/09/2017, 1:04 PM  Sheran Lawless, PT [phone] 06/09/2017

## 2017-06-09 NOTE — Transfer of Care (Signed)
Immediate Anesthesia Transfer of Care Note  Patient: Fernando Griffin  Procedure(s) Performed: ESOPHAGOGASTRODUODENOSCOPY (EGD) (N/A )  Patient Location: PACU  Anesthesia Type:MAC  Level of Consciousness: awake  Airway & Oxygen Therapy: Patient Spontanous Breathing  Post-op Assessment: Report given to RN and Post -op Vital signs reviewed and stable  Post vital signs: Reviewed and stable  Last Vitals:  Vitals:   06/09/17 1100 06/09/17 1304  BP: 107/68 (!) 146/79  Pulse: 82 91  Resp: 14 14  Temp: 36.8 C 36.6 C  SpO2:  100%    Last Pain:  Vitals:   06/09/17 1304  TempSrc: Oral  PainSc:          Complications: No apparent anesthesia complications

## 2017-06-09 NOTE — Progress Notes (Signed)
PROGRESS NOTE    Fernando Griffin  UEA:[id number] DOB: 1971/03/04 DOA: 06/08/2017 PCP: Kaleen Mask, MD   Outpatient Specialists:     Brief Narrative:  Fernando Griffin is a 46 y.o. male with medical history significant of learning disability, who presents with syncope, abdominal pain, hematemesis and tarry stool and URI symptoms  Per his mother, who didn't think patient has been having some upper respiratory infection symptoms recently, including cough, runny nose, nasal congestion, but no chest pain or shortness of breath. No fever or chills. Patient had flu shot on Monday, which seems to have made symptoms worse. Pt is taking tylenol/flu pills. His mother states that pt had a syncope episode in the early morning at about 2 AM. Patient passed out and was found to be on the floor. Pt was seen in ED in AM, and had negative workup, and was diagnosed as possible vasovagal syncope. Pt was discharged home in stable condition after treated with IV fluid.  Pt went home and went to bed. Around 11 AM, the patient had one episode of crampy abdominal pain, followed by an episode of f black tarry diarrhea. He went back to bed again. Around 1:30 PM, pt woke up and asked his mother for some cereal with bananas. She states that she fed him when he is sitting in the recliner, he suddenly became ill and started vomiting mucus mixed with black materials. Currently patient does not have a nausea, vomiting or abdominal pain. Patient does not have chest pain, dizziness or lightheadedness. Denies symptoms of UTI. No unilateral weakness, numbness or tingling in his extremities.    Assessment & Plan:   Principal Problem:   GIB (gastrointestinal bleeding) Active Problems:   Syncope   Normocytic anemia   URI (upper respiratory infection)   Learning disability   Hematemesis with nausea   GIB (gastrointestinal bleeding): -Likely due to upper GIB. No history of GIB. Hemoglobin dropped from 10.0-->8.8.  -s/p  EGD:    - LA Grade C reflux esophagitis with friable mucosa.                           - Benign-appearing esophageal stenosis.                           - Medium-sized hiatal hernia.                           - Erythematous duodenopathy.                           - Normal second portion of the duodenum.                           - No specimens collected. Recommendation:                                      - Clear liquid diet 10/4                           - Continue IV pantoprazole infusion overnight. If  stable tomorrow change to pantoprazole 40 mg po bid                            for 1 month and then 40 mg po qam long term.                           - Antireflux measures long term.                           - Avoid ASA/NSAID products for 1 month.                           - GI office follow up in 1 month.  Syncope:  -positive orthostatic vital signs on admission, which is likely the reason. -IVF  Normocytic anemia due to GIB: -transfuse as needed for Hgb<7.0  rhinovirus -When necessary Mucinex for cough -symptomatic treatment   DVT prophylaxis:  SCD's  Code Status: Full Code   Family Communication: At bedside  Disposition Plan:  Home in AM   Consultants:   GI    Subjective: No overnight event-- excited to eat-- has been NPO for EGD  Objective: Vitals:   06/09/17 1100 06/09/17 1304 06/09/17 1347 06/09/17 1359  BP: 107/68 (!) 146/79 (!) 126/96 (!) 144/57  Pulse: 82 91  100  Resp: Temp: 98.2 F (36.8 C) 97.8 F (36.6 C) 97.8 F (36.6 C)   TempSrc: Oral Oral Oral   SpO2:  100% 99% 100%  Height:        Intake/Output Summary (Last 24 hours) at 06/09/17 1603 Last data filed at 06/09/17 1345  Gross per 24 hour  Intake           2952.5 ml  Output              400 ml  Net           2552.5 ml   There were no vitals filed for this visit.  Examination:  General exam: Appears calm and comfortable  Respiratory  system: Clear to auscultation. Respiratory effort normal. Cardiovascular system: S1 & S2 heard, RRR. No JVD, murmurs, rubs, gallops or clicks. No pedal edema. Gastrointestinal system: Abdomen is nondistended, soft and nontender. No organomegaly or masses felt. Normal bowel sounds heard. Central nervous system: Alert  Extremities: Symmetric 5 x 5 power. Skin: No rashes, lesions or ulcers Psychiatry: Judgement and insight appear normal. Mood & affect appropriate.     Data Reviewed: I have personally reviewed following labs and imaging studies  CBC:  Recent Labs Lab 06/08/17 0416 06/08/17 1640 06/08/17 2119 06/09/17 0403 06/09/17 0818  WBC 12.9* 10.2 13.5* 8.9 9.4  HGB 10.0* 10.3* 8.8* 8.5* 8.8*  HCT 29.3* 31.0* 26.3* 24.9* 25.9*  MCV 92.1 91.4 91.0 90.9 90.9  PLT 224 267 287 211 222   Basic Metabolic Panel:  Recent Labs Lab 06/08/17 0416 06/08/17 1640 06/09/17 0818  NA 138 138 138  K 3.8 4.0 3.6  CL 108 109 112*  CO2 22 21* 22  GLUCOSE 126* 125* 100*  BUN 35* 42* 19  CREATININE 0.86 0.70 0.76  CALCIUM 7.7* 8.4* 7.9*   GFR: Estimated Creatinine Clearance: 117.2 mL/min (by C-G formula based on SCr of 0.76 mg/dL). Liver Function Tests:  Recent Labs Lab 06/08/17 0416 06/08/17 1640  AST  19 20  ALT 18 18  ALKPHOS 46 42  BILITOT 0.7 1.2  PROT 4.7* 5.6*  ALBUMIN 2.9* 3.4*    Recent Labs Lab 06/08/17 0416  LIPASE 35   No results for input(s): AMMONIA in the last 168 hours. Coagulation Profile:  Recent Labs Lab 06/08/17 2006  INR 1.15   Cardiac Enzymes: No results for input(s): CKTOTAL, CKMB, CKMBINDEX, TROPONINI in the last 168 hours. BNP (last 3 results) No results for input(s): PROBNP in the last 8760 hours. HbA1C: No results for input(s): HGBA1C in the last 72 hours. CBG:  Recent Labs Lab 06/08/17 0415  GLUCAP 115*   Lipid Profile: No results for input(s): CHOL, HDL, LDLCALC, TRIG, CHOLHDL, LDLDIRECT in the last 72 hours. Thyroid Function  Tests: No results for input(s): TSH, T4TOTAL, FREET4, T3FREE, THYROIDAB in the last 72 hours. Anemia Panel: No results for input(s): VITAMINB12, FOLATE, FERRITIN, TIBC, IRON, RETICCTPCT in the last 72 hours. Urine analysis:    Component Value Date/Time   COLORURINE STRAW (A) 06/08/2017 0600   APPEARANCEUR CLEAR 06/08/2017 0600   LABSPEC 1.018 06/08/2017 0600   PHURINE 7.0 06/08/2017 0600   GLUCOSEU NEGATIVE 06/08/2017 0600   HGBUR SMALL (A) 06/08/2017 0600   BILIRUBINUR NEGATIVE 06/08/2017 0600   KETONESUR 5 (A) 06/08/2017 0600   PROTEINUR NEGATIVE 06/08/2017 0600   NITRITE NEGATIVE 06/08/2017 0600   LEUKOCYTESUR NEGATIVE 06/08/2017 0600     ) Recent Results (from the past 240 hour(s))  Respiratory Panel by PCR     Status: Abnormal   Collection Time: 06/08/17  8:58 PM  Result Value Ref Range Status   Adenovirus NOT DETECTED NOT DETECTED Final   Coronavirus 229E NOT DETECTED NOT DETECTED Final   Coronavirus HKU1 NOT DETECTED NOT DETECTED Final   Coronavirus NL63 NOT DETECTED NOT DETECTED Final   Coronavirus OC43 NOT DETECTED NOT DETECTED Final   Metapneumovirus NOT DETECTED NOT DETECTED Final   Rhinovirus / Enterovirus DETECTED (A) NOT DETECTED Final   Influenza A NOT DETECTED NOT DETECTED Final   Influenza B NOT DETECTED NOT DETECTED Final   Parainfluenza Virus 1 NOT DETECTED NOT DETECTED Final   Parainfluenza Virus 2 NOT DETECTED NOT DETECTED Final   Parainfluenza Virus 3 NOT DETECTED NOT DETECTED Final   Parainfluenza Virus 4 NOT DETECTED NOT DETECTED Final   Respiratory Syncytial Virus NOT DETECTED NOT DETECTED Final   Bordetella pertussis NOT DETECTED NOT DETECTED Final   Chlamydophila pneumoniae NOT DETECTED NOT DETECTED Final   Mycoplasma pneumoniae NOT DETECTED NOT DETECTED Final  Culture, blood (x 2)     Status: None (Preliminary result)   Collection Time: 06/08/17  8:59 PM  Result Value Ref Range Status   Specimen Description BLOOD RIGHT ANTECUBITAL  Final    Special Requests   Final    BOTTLES DRAWN AEROBIC AND ANAEROBIC Blood Culture adequate volume   Culture NO GROWTH < 24 HOURS  Final   Report Status PENDING  Incomplete  Culture, blood (x 2)     Status: None (Preliminary result)   Collection Time: 06/08/17  9:04 PM  Result Value Ref Range Status   Specimen Description BLOOD RIGHT FOREARM  Final   Special Requests   Final    BOTTLES DRAWN AEROBIC AND ANAEROBIC Blood Culture adequate volume   Culture NO GROWTH < 24 HOURS  Final   Report Status PENDING  Incomplete      Anti-infectives    None       Radiology Studies: Dg Chest 2 View  Result Date: 06/08/2017 CLINICAL DATA:  Cough , syncope.  Symptoms began after flu shot. EXAM: CHEST  2 VIEW COMPARISON:  None. FINDINGS: Cardiomediastinal silhouette is normal. Trace calcific atherosclerosis aortic arch. Mild bronchitic changes. No pleural effusions or focal consolidations. Biapical pleural thickening. Trachea projects midline and there is no pneumothorax. Soft tissue planes and included osseous structures are non-suspicious. IMPRESSION: Mild bronchitic changes without focal consolidation. Aortic Atherosclerosis (ICD10-I70.0). Electronically Signed   By: Awilda Metro M.D.   On: 06/08/2017 22:06   Ct Head Wo Contrast  Result Date: 06/08/2017 CLINICAL DATA:  Syncope EXAM: CT HEAD WITHOUT CONTRAST TECHNIQUE: Contiguous axial images were obtained from the base of the skull through the vertex without intravenous contrast. COMPARISON:  None. FINDINGS: Brain: No mass lesion, intraparenchymal hemorrhage or extra-axial collection. No evidence of acute cortical infarct. Brain parenchyma and CSF-containing spaces are normal for age. Vascular: No hyperdense vessel or unexpected calcification. Skull: Normal visualized skull base, calvarium and extracranial soft tissues. Sinuses/Orbits: No sinus fluid levels or advanced mucosal thickening. No mastoid effusion. Normal orbits. IMPRESSION: Normal head CT.  Electronically Signed   By: Deatra Robinson M.D.   On: 06/08/2017 21:49        Scheduled Meds: Continuous Infusions: . sodium chloride 1,000 mL (06/09/17 0627)  . pantoprozole (PROTONIX) infusion 8 mg/hr (06/09/17 0628)     LOS: 0 days    Time spent: 35 min    Adiel Mcnamara U Deeya Richeson, DO [facility] Pager [phone]  If 7PM-7AM, please contact night-coverage [email or web address] Password [email or web address] 06/09/2017, 4:03 PM

## 2017-06-09 NOTE — Anesthesia Preprocedure Evaluation (Addendum)
Anesthesia Evaluation  Patient identified by MRN, date of birth, ID band Patient awake    Reviewed: Allergy & Precautions, NPO status , Patient's Chart, lab work & pertinent test results  Airway Mallampati: II  TM Distance: >3 FB Neck ROM: Full    Dental no notable dental hx.    Pulmonary neg pulmonary ROS,    Pulmonary exam normal breath sounds clear to auscultation       Cardiovascular negative cardio ROS Normal cardiovascular exam Rhythm:Regular Rate:Normal     Neuro/Psych Learning disability negative psych ROS   GI/Hepatic negative GI ROS, Neg liver ROS,   Endo/Other  negative endocrine ROS  Renal/GU negative Renal ROS  negative genitourinary   Musculoskeletal negative musculoskeletal ROS (+)   Abdominal   Peds negative pediatric ROS (+)  Hematology  (+) anemia ,   Anesthesia Other Findings   Reproductive/Obstetrics negative OB ROS                            Anesthesia Physical Anesthesia Plan  ASA: II  Anesthesia Plan: MAC   Post-op Pain Management:    Induction: Intravenous  PONV Risk Score and Plan:   Airway Management Planned: Nasal Cannula  Additional Equipment:   Intra-op Plan:   Post-operative Plan:   Informed Consent: I have reviewed the patients History and Physical, chart, labs and discussed the procedure including the risks, benefits and alternatives for the proposed anesthesia with the patient or authorized representative who has indicated his/her understanding and acceptance.   Dental advisory given  Plan Discussed with: CRNA and Surgeon  Anesthesia Plan Comments:         Anesthesia Quick Evaluation

## 2017-06-09 NOTE — Anesthesia Postprocedure Evaluation (Signed)
Anesthesia Post Note  Patient: Fernando Griffin  Procedure(s) Performed: ESOPHAGOGASTRODUODENOSCOPY (EGD) (N/A )     Patient location during evaluation: PACU Anesthesia Type: MAC Level of consciousness: awake and alert Pain management: pain level controlled Vital Signs Assessment: post-procedure vital signs reviewed and stable Respiratory status: spontaneous breathing, nonlabored ventilation, respiratory function stable and patient connected to nasal cannula oxygen Cardiovascular status: stable and blood pressure returned to baseline Postop Assessment: no apparent nausea or vomiting Anesthetic complications: no    Last Vitals:  Vitals:   06/09/17 1347 06/09/17 1359  BP: (!) 126/96 (!) 144/57  Pulse:  100  Resp: 14 17  Temp: 36.6 C   SpO2: 99% 100%    Last Pain:  Vitals:   06/09/17 1347  TempSrc: Oral  PainSc:                  Shivonne Schwartzman S

## 2017-06-09 NOTE — Evaluation (Signed)
Occupational Therapy Evaluation Patient Details Name: Fernando Griffin MRN: [id number] DOB: Jan 16, 1971 Today's Date: 06/09/2017    History of Present Illness 46 y.o. male with medical history significant of learning disability, who presents with syncope, abdominal pain, hematemesis and tarry stool and URI symptoms   Clinical Impression   This 46 y/o M presents with the above. At baseline Pt is independent with ADLs and functional mobility, working full-time. Pt completed seated and standing ADLs with supervision during session, initially seeking HHA for functional mobility with progression to supervision with mobility within room as well. Pt appears close to baseline for ADL completion. Education provided and questions answered throughout. No further acute OT needs identified at this time. Will sign off.     Follow Up Recommendations  No OT follow up;Supervision - Intermittent    Equipment Recommendations  None recommended by OT           Precautions / Restrictions Precautions Precautions: None Restrictions Weight Bearing Restrictions: No      Mobility Bed Mobility Overal bed mobility: Needs Assistance Bed Mobility: Supine to Sit;Sit to Supine     Supine to sit: Supervision Sit to supine: Supervision   General bed mobility comments: supervision for safety   Transfers Overall transfer level: Needs assistance Equipment used: 1 person hand held assist Transfers: Sit to/from Stand Sit to Stand: Min assist;Min guard         General transfer comment: Pt initially seeking HHA to stand from EOB and for functional mobility; progressed to mobility within room with supervision only     Balance Overall balance assessment: Needs assistance Sitting-balance support: No upper extremity supported;Feet supported Sitting balance-Leahy Scale: Normal     Standing balance support: No upper extremity supported;During functional activity Standing balance-Leahy Scale: Fair                              ADL either performed or assessed with clinical judgement   ADL                                         General ADL Comments: Pt completed seated and standing ADLs with supervision throughout this session inlcuding toileting, standing grooming, and LB dressing; Pt initially seeking HHA to begin functional mobility, progressed to functional mobility with supervision only                          Pertinent Vitals/Pain Pain Assessment: No/denies pain          Extremity/Trunk Assessment Upper Extremity Assessment Upper Extremity Assessment: Overall WFL for tasks assessed   Lower Extremity Assessment Lower Extremity Assessment: Defer to PT evaluation   Cervical / Trunk Assessment Cervical / Trunk Assessment: Normal   Communication Communication Communication: Other (comment) (baseline learning disability, some expressive difficulties but is intelligible )   Cognition Arousal/Alertness: Awake/alert Behavior During Therapy: WFL for tasks assessed/performed Overall Cognitive Status: History of cognitive impairments - at baseline                                                      Home Living Family/patient expects to be discharged to:: Private residence Living Arrangements: Parent  Available Help at Discharge: Family;Available PRN/intermittently (Pt and mother both work during the day ) Type of Home: House Home Access: Stairs to enter Secretary/administrator of Steps: 2 Entrance Stairs-Rails: Left Home Layout: One level     Bathroom Shower/Tub: Producer, television/film/video: Standard                Prior Functioning/Environment Level of Independence: Independent        Comments: 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         OT Problem List: Decreased activity tolerance;Decreased knowledge of use of DME or AE            OT Goals(Current goals can be found in the care  plan section) Acute Rehab OT Goals Patient Stated Goal: none stated  OT Goal Formulation: All assessment and education complete, DC therapy                                 AM-PAC PT "6 Clicks" Daily Activity     Outcome Measure Help from another person eating meals?: None Help from another person taking care of personal grooming?: None Help from another person toileting, which includes using toliet, bedpan, or urinal?: None Help from another person bathing (including washing, rinsing, drying)?: A Little Help from another person to put on and taking off regular upper body clothing?: None Help from another person to put on and taking off regular lower body clothing?: A Little 6 Click Score: 22   End of Session Equipment Utilized During Treatment: Gait belt Nurse Communication: Mobility status  Activity Tolerance: Patient tolerated treatment well Patient left: in bed;with call bell/phone within reach;with family/visitor present  OT Visit Diagnosis: Unsteadiness on feet (R26.81)                Time: 1610-9604 OT Time Calculation (min): 18 min Charges:  OT General Charges $OT Visit: 1 Visit OT Evaluation $OT Eval Low Complexity: 1 Low G-Codes: OT G-codes **NOT FOR INPATIENT CLASS** Functional Assessment Tool Used: AM-PAC 6 Clicks Daily Activity;Clinical judgement Functional Limitation: Self care Self Care Current Status (V4098): At least 1 percent but less than 20 percent impaired, limited or restricted Self Care Goal Status (J1914): At least 1 percent but less than 20 percent impaired, limited or restricted Self Care Discharge Status [id number]): At least 1 percent but less than 20 percent impaired, limited or restricted   Fernando Griffin, OT Pager [phone] 06/09/2017   Orlando Penner 06/09/2017, 9:53 AM

## 2017-06-09 NOTE — Consult Note (Signed)
 Referring Provider: [facility]   Primary Care Physician:  Elkins, Wilson Oliver, MD Primary Gastroenterologist:   Unassigned  Reason for Consultation:  GI bleed    Attending physician's note   I have taken a history, examined the patient and reviewed the chart. I agree with the Advanced Practitioner's note, impression and recommendations.  Acute UGI bleed with hematemesis, melena, syncope and ABL anemia.  IV PPI infusion for now. Trend CBC. Avoid ASA/NSAIDs. EGD today.   Emir Nack, MD FACG [phone] Mon-Fri 8a-5p [phone] after 5p, weekends, holidays   ASSESSMENT AND PLAN:    1.  46 yo relatively healthy male with upper GI bleeding and symptomatic anemia.  Recent use of Alka Seltzer (contains ASA). Rule out PUD. -EGD today. The risks and benefits of EGD were discussed and the patient agrees to proceed.  -trend hgb, currently stable at 8.8, down from 10 on admission.  -continue PPI gtt.   2.  Learning disability.  HPI: Fernando Griffin is a 46 y.o. male brought to hospital by EMS after unwitnessed syncopal episode at home. Family found him diaphoretic, pale. Family then witnessed episode of hematemesis and melena. EMS gave him IVF, BP improved. Initially hgb 10, now at 8.8. BUN was up at 42. He has no hx of PUD. Recently had cold symptoms and took alka seltzer cold. Other than that he took tylenol. Benson has no GI history. He has no significant medical history other than a learning disability. His weight has been stable. BMs normal. Currently he has no SOB, chest pain or dizziness.   Past Medical History:  Diagnosis Date  . Learning disability     Past Surgical History:  Procedure Laterality Date  . DENTAL SURGERY      Prior to Admission medications   Medication Sig Start Date End Date Taking? Authorizing Provider  Pseudoeph-Doxylamine-DM-APAP (TYLENOL COLD/FLU SEVERE NIGHT PO) Take 2 tablets by mouth at bedtime.   Yes [provider]    Current  Facility-Administered Medications  Medication Dose Route Frequency Provider Last Rate Last Dose  . 0.9 %  sodium chloride infusion  1,000 mL Intravenous Continuous Harris, Abigail, PA-C 125 mL/hr at 06/09/17 0627 1,000 mL at 06/09/17 0627  . acetaminophen (TYLENOL) tablet 650 mg  650 mg Oral Q6H PRN Niu, Xilin, MD       Or  . acetaminophen (TYLENOL) suppository 650 mg  650 mg Rectal Q6H PRN Niu, Xilin, MD      . dextromethorphan-guaiFENesin (MUCINEX DM) 30-600 MG per 12 hr tablet 1 tablet  1 tablet Oral BID PRN Niu, Xilin, MD      . ondansetron (ZOFRAN) tablet 4 mg  4 mg Oral Q6H PRN Niu, Xilin, MD       Or  . ondansetron (ZOFRAN) injection 4 mg  4 mg Intravenous Q6H PRN Niu, Xilin, MD      . pantoprazole (PROTONIX) 80 mg in sodium chloride 0.9 % 250 mL (0.32 mg/mL) infusion  8 mg/hr Intravenous Continuous Harris, Abigail, PA-C 25 mL/hr at 06/09/17 0628 8 mg/hr at 06/09/17 0628  . zolpidem (AMBIEN) tablet 5 mg  5 mg Oral QHS PRN Niu, Xilin, MD        Allergies as of 06/08/2017  . (No Known Allergies)    Family History  Problem Relation Age of Onset  . Brain cancer Father     Social History   Social History  . Marital status: Single    Spouse name: N/A  . Number of children: N/A  .   Years of education: N/A   Occupational History  . Not on file.   Social History Main Topics  . Smoking status: Never Smoker  . Smokeless tobacco: Not on file  . Alcohol use No  . Drug use: No  . Sexual activity: Not on file   Other Topics Concern  . Not on file   Social History Narrative  . No narrative on file    Review of Systems: All systems reviewed and negative except where noted in HPI.  Physical Exam: Vital signs in last 24 hours: Temp:  [98.3 F (36.8 C)-99.1 F (37.3 C)] 98.6 F (37 C) (10/04 0811) Pulse Rate:  [83-118] 84 (10/04 0811) Resp:  [13-20] 14 (10/04 0811) BP: (104-130)/(53-95) 113/61 (10/04 0811) SpO2:  [99 %-100 %] 100 % (10/04 0811) Last BM Date:  06/08/17 General:   Alert, well-developed,  White male in NAD Psych:  Pleasant, cooperative. Normal mood and affect. Eyes:  Pupils equal, sclera clear, no icterus.   Conjunctiva pale. Ears:  Normal auditory acuity. Nose:  No deformity, discharge,  or lesions. Neck:  Supple; no masses Lungs:  Clear throughout to auscultation.   No wheezes, crackles, or rhonchi.  Heart:  Regular rate and rhythm; no murmurs, no edema Abdomen:  Soft, non-distended, nontender, BS active, no palp mass    Msk:  Symmetrical without gross deformities. . Neurologic:  Alert and  oriented x4;  grossly normal neurologically. Skin:  Intact without significant lesions or rashes..   Intake/Output from previous day: 10/03 0701 - 10/04 0700 In: 2952.5 [I.V.:952.5; IV Piggyback:2000] Out: -  Intake/Output this shift: Total I/O In: -  Out: 400 [Urine:400]  Lab Results:  Recent Labs  06/08/17 2119 06/09/17 0403 06/09/17 0818  WBC 13.5* 8.9 9.4  HGB 8.8* 8.5* 8.8*  HCT 26.3* 24.9* 25.9*  PLT 287 211 222   BMET  Recent Labs  06/08/17 0416 06/08/17 1640 06/09/17 0818  NA 138 138 138  K 3.8 4.0 3.6  CL 108 109 112*  CO2 22 21* 22  GLUCOSE 126* 125* 100*  BUN 35* 42* 19  CREATININE 0.86 0.70 0.76  CALCIUM 7.7* 8.4* 7.9*   LFT  Recent Labs  06/08/17 1640  PROT 5.6*  ALBUMIN 3.4*  AST 20  ALT 18  ALKPHOS 42  BILITOT 1.2   PT/INR  Recent Labs  06/08/17 2006  LABPROT 14.6  INR 1.15    Studies/Results: Dg Chest 2 View  Result Date: 06/08/2017 CLINICAL DATA:  Cough , syncope.  Symptoms began after flu shot. EXAM: CHEST  2 VIEW COMPARISON:  None. FINDINGS: Cardiomediastinal silhouette is normal. Trace calcific atherosclerosis aortic arch. Mild bronchitic changes. No pleural effusions or focal consolidations. Biapical pleural thickening. Trachea projects midline and there is no pneumothorax. Soft tissue planes and included osseous structures are non-suspicious. IMPRESSION: Mild bronchitic  changes without focal consolidation. Aortic Atherosclerosis (ICD10-I70.0). Electronically Signed   By: Courtnay  Bloomer M.D.   On: 06/08/2017 22:06   Ct Head Wo Contrast  Result Date: 06/08/2017 CLINICAL DATA:  Syncope EXAM: CT HEAD WITHOUT CONTRAST TECHNIQUE: Contiguous axial images were obtained from the base of the skull through the vertex without intravenous contrast. COMPARISON:  None. FINDINGS: Brain: No mass lesion, intraparenchymal hemorrhage or extra-axial collection. No evidence of acute cortical infarct. Brain parenchyma and CSF-containing spaces are normal for age. Vascular: No hyperdense vessel or unexpected calcification. Skull: Normal visualized skull base, calvarium and extracranial soft tissues. Sinuses/Orbits: No sinus fluid levels or advanced mucosal thickening. No mastoid   effusion. Normal orbits. IMPRESSION: Normal head CT. Electronically Signed   By: Kevin  Herman M.D.   On: 06/08/2017 21:49    Paula Guenther, NP-C @  06/09/2017, 11:09 AM  Pager number [phone]   

## 2017-06-09 NOTE — Interval H&P Note (Signed)
History and Physical Interval Note:  06/09/2017 1:06 PM  Fernando Griffin  has presented today for surgery, with the diagnosis of hematemesis  The various methods of treatment have been discussed with the patient and family. After consideration of risks, benefits and other options for treatment, the patient has consented to  Procedure(s): ESOPHAGOGASTRODUODENOSCOPY (EGD) (N/A) as a surgical intervention .  The patient's history has been reviewed, patient examined, no change in status, stable for surgery.  I have reviewed the patient's chart and labs.  Questions were answered to the patient's satisfaction.     Venita Lick. Russella Dar

## 2017-06-10 ENCOUNTER — Encounter (HOSPITAL_COMMUNITY): Payer: Self-pay | Admitting: Gastroenterology

## 2017-06-10 DIAGNOSIS — F819 Developmental disorder of scholastic skills, unspecified: Secondary | ICD-10-CM

## 2017-06-10 DIAGNOSIS — D62 Acute posthemorrhagic anemia: Secondary | ICD-10-CM

## 2017-06-10 DIAGNOSIS — K922 Gastrointestinal hemorrhage, unspecified: Secondary | ICD-10-CM

## 2017-06-10 DIAGNOSIS — K920 Hematemesis: Secondary | ICD-10-CM

## 2017-06-10 LAB — BASIC METABOLIC PANEL
ANION GAP: 4 — AB (ref 5–15)
BUN: 10 mg/dL (ref 6–20)
CHLORIDE: 107 mmol/L (ref 101–111)
CO2: 23 mmol/L (ref 22–32)
Calcium: 8.4 mg/dL — ABNORMAL LOW (ref 8.9–10.3)
Creatinine, Ser: 0.84 mg/dL (ref 0.61–1.24)
Glucose, Bld: 99 mg/dL (ref 65–99)
POTASSIUM: 3.4 mmol/L — AB (ref 3.5–5.1)
SODIUM: 134 mmol/L — AB (ref 135–145)

## 2017-06-10 LAB — CBC
HCT: 25.7 % — ABNORMAL LOW (ref 39.0–52.0)
HEMOGLOBIN: 8.8 g/dL — AB (ref 13.0–17.0)
MCH: 30.9 pg (ref 26.0–34.0)
MCHC: 34.2 g/dL (ref 30.0–36.0)
MCV: 90.2 fL (ref 78.0–100.0)
Platelets: 244 10*3/uL (ref 150–400)
RBC: 2.85 MIL/uL — AB (ref 4.22–5.81)
RDW: 12.7 % (ref 11.5–15.5)
WBC: 11.9 10*3/uL — ABNORMAL HIGH (ref 4.0–10.5)

## 2017-06-10 LAB — GLUCOSE, CAPILLARY: Glucose-Capillary: 92 mg/dL (ref 65–99)

## 2017-06-10 MED ORDER — PANTOPRAZOLE SODIUM 40 MG PO TBEC
40.0000 mg | DELAYED_RELEASE_TABLET | Freq: Two times a day (BID) | ORAL | Status: DC
  Administered 2017-06-10: 40 mg via ORAL
  Filled 2017-06-10: qty 1

## 2017-06-10 MED ORDER — PANTOPRAZOLE SODIUM 40 MG PO TBEC: 40.0000 mg | DELAYED_RELEASE_TABLET | Freq: Two times a day (BID) | ORAL | 0 refills | Status: DC

## 2017-06-10 NOTE — Discharge Summary (Signed)
Pt discharges home with mother. AVS discussed with patient and family.

## 2017-06-10 NOTE — Progress Notes (Signed)
Daily Rounding Note  06/10/2017, 9:33 AM  LOS: 1 day   SUBJECTIVE:   Chief complaint: no further n/v.  No BMs.  Tolerating clears, MD advanced to soft diet this AM.  Not dizzy, no abd pain     OBJECTIVE:         Vital signs in last 24 hours:    Temp:  [97.5 F (36.4 C)-98.2 F (36.8 C)] 97.5 F (36.4 C) (10/05 0543) Pulse Rate:  [82-105] 105 (10/05 0543) Resp:  [14-17] 16 (10/05 0543) BP: (107-146)/(57-96) 115/64 (10/05 0543) SpO2:  [96 %-100 %] 96 % (10/05 0543) Last BM Date: 06/09/17 There were no vitals filed for this visit. General: pleasant, simple, comfortable.  Looks well   Heart: RRR Chest: clear bil.  No cough or labored breathing.   Abdomen: soft, NT, ND.  Active BS.  Extremities: no CCE Neuro/Psych:  Alert.  Appropriated.  Speech halting, limited.   Intake/Output from previous day: 10/04 0701 - 10/05 0700 In: 722.5 [P.O.:120; I.V.:602.5] Out: 400 [Urine:400]  Intake/Output this shift: No intake/output data recorded.  Lab Results:  Recent Labs  06/09/17 0403 06/09/17 0818 06/10/17 0514  WBC 8.9 9.4 11.9*  HGB 8.5* 8.8* 8.8*  HCT 24.9* 25.9* 25.7*  PLT 211 222 244   BMET  Recent Labs  06/08/17 1640 06/09/17 0818 06/10/17 0514  NA 138 138 134*  K 4.0 3.6 3.4*  CL 109 112* 107  CO2 21* 22 23  GLUCOSE 125* 100* 99  BUN 42* 19 10  CREATININE 0.70 0.76 0.84  CALCIUM 8.4* 7.9* 8.4*   LFT  Recent Labs  06/08/17 0416 06/08/17 1640  PROT 4.7* 5.6*  ALBUMIN 2.9* 3.4*  AST 19 20  ALT 18 18  ALKPHOS 46 42  BILITOT 0.7 1.2   PT/INR  Recent Labs  06/08/17 2006  LABPROT 14.6  INR 1.15   Hepatitis Panel No results for input(s): HEPBSAG, HCVAB, HEPAIGM, HEPBIGM in the last 72 hours.  Studies/Results: Dg Chest 2 View  Result Date: 06/08/2017 CLINICAL DATA:  Cough , syncope.  Symptoms began after flu shot. EXAM: CHEST  2 VIEW COMPARISON:  None. FINDINGS: Cardiomediastinal  silhouette is normal. Trace calcific atherosclerosis aortic arch. Mild bronchitic changes. No pleural effusions or focal consolidations. Biapical pleural thickening. Trachea projects midline and there is no pneumothorax. Soft tissue planes and included osseous structures are non-suspicious. IMPRESSION: Mild bronchitic changes without focal consolidation. Aortic Atherosclerosis (ICD10-I70.0). Electronically Signed   By: Awilda Metro M.D.   On: 06/08/2017 22:06   Ct Head Wo Contrast  Result Date: 06/08/2017 CLINICAL DATA:  Syncope EXAM: CT HEAD WITHOUT CONTRAST TECHNIQUE: Contiguous axial images were obtained from the base of the skull through the vertex without intravenous contrast. COMPARISON:  None. FINDINGS: Brain: No mass lesion, intraparenchymal hemorrhage or extra-axial collection. No evidence of acute cortical infarct. Brain parenchyma and CSF-containing spaces are normal for age. Vascular: No hyperdense vessel or unexpected calcification. Skull: Normal visualized skull base, calvarium and extracranial soft tissues. Sinuses/Orbits: No sinus fluid levels or advanced mucosal thickening. No mastoid effusion. Normal orbits. IMPRESSION: Normal head CT. Electronically Signed   By: Deatra Robinson M.D.   On: 06/08/2017 21:49    ASSESMENT:   *  Acute GIB with melena, anemia, syncope.    10/4 EGD with grade C reflux esophagitis, benign esophageal stenosis, medium HH, duodenal erythema. Hgb 10.3 >> 8.5 >> 8.8 >> 8.8.  No transfusion to date.  PLAN   *  Protonix 40 mg BID or equivalent BID PPI, through 07/10/17, then drop dose to q day.    *  Regular diet.   *  No NSAIDs, ASA for 1 month (was taking alka seltzer).     * GI ROV with Dr Russella Dar on 11/15 at 9 AM.     Jennye Moccasin  06/10/2017, 9:33 AM Pager: [phone]    Attending physician's note   I have taken an interval history, reviewed the chart and examined the patient. I agree with the Advanced Practitioner's note, impression and  recommendations.  Acute bleed from esophagitis with syncope and ABL anemia. Hb stable, no recurrent bleeding or syncope.  PPI bid for 1 month then PPI po qam long term.  Avoid ASA/NSAIDs for 1 month.  OK for discharge from GI standpoint. GI appt on 11/15.  GI signing off.   Claudette Head, MD Clementeen Graham [phone] Mon-Fri 8a-5p [phone] after 5p, weekends, holidays

## 2017-06-10 NOTE — Discharge Summary (Signed)
Physician Discharge Summary  Fernando Griffin ZOX:[id number] DOB: 09/14/1970 DOA: 06/08/2017  PCP: Fernando Mask, MD  Admit date: 06/08/2017 Discharge date: 06/10/2017  Time spent: 35 minutes  Recommendations for Outpatient Follow-up:  1. [facility] -appt on 11/15   Discharge Diagnoses:  Principal Problem:   GIB (gastrointestinal bleeding)   Esophagitis   Syncope   Normocytic anemia   URI (upper respiratory infection)   Learning disability   Hematemesis with nausea   Rhinovirus   Acute blood loss anemia   Discharge Condition: stable  Diet recommendation: regular  There were no vitals filed for this visit.  History of present illness:  Fernando Griffin a 46 y.o.malewith medical history significant of learning disability, who presents with syncope, abdominal pain, hematemesis and tarry stool and URI symptoms  Hospital Course:   1. Upper GI bleed/Melena -due to esophagitis -treated with IV PPI, Gi consulted, s/p EGD with  grade C reflux esophagitis, benign esophageal stenosis, medium HH, duodenal erythema -Hb stable and tolerating diet now -discharged home on PPI BID and advised FU with [facility]   2. Acute blood loss anemia -mild drop with blood loss, but Hb stable since -EGD as noted above -now stable  3. Vasovagal syncope -in setting of above -stable and improved now   4. Learning disability -lives with mother  5. Rhinovirus URI -supportive care  Procedures: Impression:               - LA Grade C reflux esophagitis with friable mucosa.                           - Benign-appearing esophageal stenosis.                           - Medium-sized hiatal hernia.                           - Erythematous duodenopathy.                           - Normal second portion of the duodenum  Consultations:  [facility]  Discharge Exam: Vitals:   06/09/17 2100 06/10/17 0543  BP: (!) 125/57 115/64  Pulse: 88 (!) 105  Resp: 17 16  Temp: 97.7 F (36.5 C) (!) 97.5 F  (36.4 C)  SpO2: 100% 96%    General: AAOx3 Cardiovascular: S1S2/RRR Respiratory: CTAB  Discharge Instructions   Discharge Instructions    Diet - low sodium heart healthy    Complete by:  As directed    Increase activity slowly    Complete by:  As directed      Current Discharge Medication List    START taking these medications   Details  pantoprazole (PROTONIX) 40 MG tablet Take 1 tablet (40 mg total) by mouth 2 (two) times daily. Take twice a day for 1 month and then once daily Qty: 60 tablet, Refills: 0      STOP taking these medications     Pseudoeph-Doxylamine-DM-APAP (TYLENOL COLD/FLU SEVERE NIGHT PO)        No Known Allergies Follow-up Information    Fernando Dare, MD Follow up on 07/21/2017.   Specialty:  Gastroenterology Why:  9 AM.  follow up with GI doctor.   Contact information: [address] [place] [address] [phone]  Fernando Mask, MD. Schedule an appointment as soon as possible for a visit in 1 week(s).   Specialty:  Family Medicine Contact information: [address] [place] [address] [phone]            The results of significant diagnostics from this hospitalization (including imaging, microbiology, ancillary and laboratory) are listed below for reference.    Significant Diagnostic Studies: Dg Chest 2 View  Result Date: 06/08/2017 CLINICAL DATA:  Cough , syncope.  Symptoms began after flu shot. EXAM: CHEST  2 VIEW COMPARISON:  None. FINDINGS: Cardiomediastinal silhouette is normal. Trace calcific atherosclerosis aortic arch. Mild bronchitic changes. No pleural effusions or focal consolidations. Biapical pleural thickening. Trachea projects midline and there is no pneumothorax. Soft tissue planes and included osseous structures are non-suspicious. IMPRESSION: Mild bronchitic changes without focal consolidation. Aortic Atherosclerosis (ICD10-I70.0). Electronically Signed   By: Awilda Metro  M.D.   On: 06/08/2017 22:06   Ct Head Wo Contrast  Result Date: 06/08/2017 CLINICAL DATA:  Syncope EXAM: CT HEAD WITHOUT CONTRAST TECHNIQUE: Contiguous axial images were obtained from the base of the skull through the vertex without intravenous contrast. COMPARISON:  None. FINDINGS: Brain: No mass lesion, intraparenchymal hemorrhage or extra-axial collection. No evidence of acute cortical infarct. Brain parenchyma and CSF-containing spaces are normal for age. Vascular: No hyperdense vessel or unexpected calcification. Skull: Normal visualized skull base, calvarium and extracranial soft tissues. Sinuses/Orbits: No sinus fluid levels or advanced mucosal thickening. No mastoid effusion. Normal orbits. IMPRESSION: Normal head CT. Electronically Signed   By: Deatra Robinson M.D.   On: 06/08/2017 21:49    Microbiology: Recent Results (from the past 240 hour(s))  Respiratory Panel by PCR     Status: Abnormal   Collection Time: 06/08/17  8:58 PM  Result Value Ref Range Status   Adenovirus NOT DETECTED NOT DETECTED Final   Coronavirus 229E NOT DETECTED NOT DETECTED Final   Coronavirus HKU1 NOT DETECTED NOT DETECTED Final   Coronavirus NL63 NOT DETECTED NOT DETECTED Final   Coronavirus OC43 NOT DETECTED NOT DETECTED Final   Metapneumovirus NOT DETECTED NOT DETECTED Final   Rhinovirus / Enterovirus DETECTED (A) NOT DETECTED Final   Influenza A NOT DETECTED NOT DETECTED Final   Influenza B NOT DETECTED NOT DETECTED Final   Parainfluenza Virus 1 NOT DETECTED NOT DETECTED Final   Parainfluenza Virus 2 NOT DETECTED NOT DETECTED Final   Parainfluenza Virus 3 NOT DETECTED NOT DETECTED Final   Parainfluenza Virus 4 NOT DETECTED NOT DETECTED Final   Respiratory Syncytial Virus NOT DETECTED NOT DETECTED Final   Bordetella pertussis NOT DETECTED NOT DETECTED Final   Chlamydophila pneumoniae NOT DETECTED NOT DETECTED Final   Mycoplasma pneumoniae NOT DETECTED NOT DETECTED Final  Culture, blood (x 2)     Status:  None (Preliminary result)   Collection Time: 06/08/17  8:59 PM  Result Value Ref Range Status   Specimen Description BLOOD RIGHT ANTECUBITAL  Final   Special Requests   Final    BOTTLES DRAWN AEROBIC AND ANAEROBIC Blood Culture adequate volume   Culture NO GROWTH < 24 HOURS  Final   Report Status PENDING  Incomplete  Culture, blood (x 2)     Status: None (Preliminary result)   Collection Time: 06/08/17  9:04 PM  Result Value Ref Range Status   Specimen Description BLOOD RIGHT FOREARM  Final   Special Requests   Final    BOTTLES DRAWN AEROBIC AND ANAEROBIC Blood Culture adequate volume   Culture NO  GROWTH < 24 HOURS  Final   Report Status PENDING  Incomplete     Labs: Basic Metabolic Panel:  Recent Labs Lab 06/08/17 0416 06/08/17 1640 06/09/17 0818 06/10/17 0514  NA 138 138 138 134*  K 3.8 4.0 3.6 3.4*  CL 108 109 112* 107  CO2 22 21* 22 23  GLUCOSE 126* 125* 100* 99  BUN 35* 42* 19 10  CREATININE 0.86 0.70 0.76 0.84  CALCIUM 7.7* 8.4* 7.9* 8.4*   Liver Function Tests:  Recent Labs Lab 06/08/17 0416 06/08/17 1640  AST 19 20  ALT 18 18  ALKPHOS 46 42  BILITOT 0.7 1.2  PROT 4.7* 5.6*  ALBUMIN 2.9* 3.4*    Recent Labs Lab 06/08/17 0416  LIPASE 35   No results for input(s): AMMONIA in the last 168 hours. CBC:  Recent Labs Lab 06/08/17 1640 06/08/17 2119 06/09/17 0403 06/09/17 0818 06/10/17 0514  WBC 10.2 13.5* 8.9 9.4 11.9*  HGB 10.3* 8.8* 8.5* 8.8* 8.8*  HCT 31.0* 26.3* 24.9* 25.9* 25.7*  MCV 91.4 91.0 90.9 90.9 90.2  PLT 267 287 211 222 244   Cardiac Enzymes: No results for input(s): CKTOTAL, CKMB, CKMBINDEX, TROPONINI in the last 168 hours. BNP: BNP (last 3 results) No results for input(s): BNP in the last 8760 hours.  ProBNP (last 3 results) No results for input(s): PROBNP in the last 8760 hours.  CBG:  Recent Labs Lab 06/08/17 0415 06/10/17 0815  GLUCAP 115* 92       Signed:  Lesley Atkin MD.  [facility] 06/10/2017, 2:47 PM

## 2017-06-13 LAB — CULTURE, BLOOD (ROUTINE X 2)
CULTURE: NO GROWTH
Culture: NO GROWTH
SPECIAL REQUESTS: ADEQUATE
Special Requests: ADEQUATE

## 2017-07-21 ENCOUNTER — Encounter: Payer: Self-pay | Admitting: Gastroenterology

## 2017-07-21 ENCOUNTER — Ambulatory Visit: Payer: BC Managed Care – PPO | Admitting: Gastroenterology

## 2017-07-21 VITALS — BP 108/68 | HR 66 | Ht 77.0 in | Wt 181.1 lb

## 2017-07-21 DIAGNOSIS — K2100 Gastro-esophageal reflux disease with esophagitis, without bleeding: Secondary | ICD-10-CM

## 2017-07-21 DIAGNOSIS — Z8719 Personal history of other diseases of the digestive system: Secondary | ICD-10-CM | POA: Diagnosis not present

## 2017-07-21 DIAGNOSIS — D62 Acute posthemorrhagic anemia: Secondary | ICD-10-CM | POA: Diagnosis not present

## 2017-07-21 MED ORDER — FERROUS SULFATE 325 (65 FE) MG PO TABS: 325.0000 mg | ORAL_TABLET | Freq: Every day | ORAL | 1 refills | Status: AC

## 2017-07-21 MED ORDER — PANTOPRAZOLE SODIUM 40 MG PO TBEC: 40.0000 mg | DELAYED_RELEASE_TABLET | Freq: Every day | ORAL | 11 refills | Status: DC

## 2017-07-21 NOTE — Patient Instructions (Signed)
We have sent the following medications to your pharmacy for you to pick up at your convenience: pantoprazole 40 mg daily, and ferrous sulfate daily x 2 months.   You have been scheduled for an endoscopy. Please follow written instructions given to you at your visit today. If you use inhalers (even only as needed), please bring them with you on the day of your procedure. Your physician has requested that you go to www.[email or web address] and enter the access code given to you at your visit today. This web site gives a general overview about your procedure. However, you should still follow specific instructions given to you by our office regarding your preparation for the procedure.   Thank you for choosing me and [facility].  Venita LickMalcolm T. Pleas KochStark, Jr., MD., Clementeen GrahamFACG

## 2017-07-21 NOTE — Progress Notes (Signed)
    History of Present Illness: This is a 46 year old male returning for follow-up after hospitalization for an upper GI bleed.  He is accompanied by his mother.  Bleed was felt secondary to grade C erosive esophagitis.  He had acute blood loss anemia and his discharge hemoglobin was 8.8.  Patient states he had blood work performed at his primary care physician's office and states he remains anemic.  He denies any reflux symptoms or any GI symptoms since discharge.  No bleeding or vomiting.  Took pantoprazole twice a day and discontinued it 1 month after discharge he was supposed to change to daily Protonix at that time.  EGD 06/2017 - LA Grade C reflux esophagitis with friable mucosa. - Benign-appearing esophageal stenosis. - Medium-sized hiatal hernia. - Erythematous duodenopathy.   Current Medications, Allergies, Past Medical History, Past Surgical History, Family History and Social History were reviewed in [facility] electronic medical record.  Physical Exam: General: Well developed, well nourished, no acute distress Head: Normocephalic and atraumatic Eyes:  sclerae anicteric, EOMI Ears: Normal auditory acuity Mouth: No deformity or lesions Lungs: Clear throughout to auscultation Heart: Regular rate and rhythm; no murmurs, rubs or bruits Abdomen: Soft, non tender and non distended. No masses, hepatosplenomegaly or hernias noted. Normal Bowel sounds Musculoskeletal: Symmetrical with no gross deformities  Pulses:  Normal pulses noted Extremities: No clubbing, cyanosis, edema or deformities noted Neurological: Alert oriented x 4, grossly nonfocal Psychological:  Alert and cooperative. Normal mood and affect  Assessment and Recommendations:  1.  Resolved upper GI bleed secondary to grade C erosive esophagitis.  Esophageal stricture, hiatal hernia. Follow antireflux measures long-term.  Pantoprazole 40 mg every morning long-term.  Schedule EGD in 2 months to document esophagitis  healing and to evaluate for underlying Barrett's. The risks (including bleeding, perforation, infection, missed lesions, medication reactions and possible hospitalization or surgery if complications occur), benefits, and alternatives to endoscopy with possible biopsy and possible dilation were discussed with the patient and they consent to proceed. REV in 1 year.   2.  ABL anemia. PCP to follow. Begin Fe daily for 2 months.

## 2017-09-22 ENCOUNTER — Encounter: Payer: Self-pay | Admitting: Gastroenterology

## 2017-10-06 ENCOUNTER — Ambulatory Visit (AMBULATORY_SURGERY_CENTER): Payer: BC Managed Care – PPO | Admitting: Gastroenterology

## 2017-10-06 ENCOUNTER — Encounter: Payer: Self-pay | Admitting: Gastroenterology

## 2017-10-06 ENCOUNTER — Other Ambulatory Visit: Payer: Self-pay

## 2017-10-06 VITALS — BP 122/77 | HR 66 | Temp 98.6°F | Resp 26 | Ht 77.0 in | Wt 181.0 lb

## 2017-10-06 DIAGNOSIS — K317 Polyp of stomach and duodenum: Secondary | ICD-10-CM

## 2017-10-06 DIAGNOSIS — K3189 Other diseases of stomach and duodenum: Secondary | ICD-10-CM | POA: Diagnosis not present

## 2017-10-06 DIAGNOSIS — K2930 Chronic superficial gastritis without bleeding: Secondary | ICD-10-CM

## 2017-10-06 DIAGNOSIS — K208 Other esophagitis without bleeding: Secondary | ICD-10-CM

## 2017-10-06 DIAGNOSIS — K2100 Gastro-esophageal reflux disease with esophagitis, without bleeding: Secondary | ICD-10-CM

## 2017-10-06 DIAGNOSIS — K229 Disease of esophagus, unspecified: Secondary | ICD-10-CM

## 2017-10-06 DIAGNOSIS — K449 Diaphragmatic hernia without obstruction or gangrene: Secondary | ICD-10-CM | POA: Diagnosis not present

## 2017-10-06 DIAGNOSIS — K219 Gastro-esophageal reflux disease without esophagitis: Secondary | ICD-10-CM | POA: Diagnosis not present

## 2017-10-06 MED ORDER — SODIUM CHLORIDE 0.9 % IV SOLN: 500.0000 mL | Freq: Once | INTRAVENOUS | Status: AC

## 2017-10-06 NOTE — Progress Notes (Signed)
Report given to PACU, vss 

## 2017-10-06 NOTE — Op Note (Signed)
[facility] Patient Name: Fernando Griffin Procedure Date: 10/06/2017 10:19 AM MRN: [id number] Endoscopist: Meryl Dare , MD Age: 47 Referring MD:  Date of Birth: 06/24/1971 Gender: Male Account #: [id number] Procedure:                Upper GI endoscopy Indications:              Follow-up of esophagitis Medicines:                Monitored Anesthesia Care Procedure:                Pre-Anesthesia Assessment:                           - Prior to the procedure, a History and Physical                            was performed, and patient medications and                            allergies were reviewed. The patient's tolerance of                            previous anesthesia was also reviewed. The risks                            and benefits of the procedure and the sedation                            options and risks were discussed with the patient.                            All questions were answered, and informed consent                            was obtained. Prior Anticoagulants: The patient has                            taken no previous anticoagulant or antiplatelet                            agents. ASA Grade Assessment: II - A patient with                            mild systemic disease. After reviewing the risks                            and benefits, the patient was deemed in                            satisfactory condition to undergo the procedure.                           After obtaining informed consent, the endoscope was  passed under direct vision. Throughout the                            procedure, the patient's blood pressure, pulse, and                            oxygen saturations were monitored continuously. The                            Model GIF-[id number](SN#[id number]) scope was introduced                            through the mouth, and advanced to the second part                            of duodenum. The upper GI  endoscopy was                            accomplished without difficulty. The patient                            tolerated the procedure well. Scope In: Scope Out: Findings:                 LA Grade A (one or more mucosal breaks less than 5                            mm, not extending between tops of 2 mucosal folds)                            esophagitis with no bleeding was found at the                            gastroesophageal junction.                           The Z-line was variable and was found at the                            gastroesophageal junction. Biopsies were taken with                            a cold forceps for histology.                           The exam of the esophagus was otherwise normal.                           A single 5 mm sessile polyp with no bleeding and no                            stigmata of recent bleeding was found in the  cardia. Biopsies were taken with a cold forceps for                            histology.                           A medium-sized hiatal hernia was present.                           Localized mild inflammation characterized by                            erythema, friability and granularity was found in                            the gastric body. Biopsies were taken with a cold                            forceps for histology.                           The exam of the stomach was otherwise normal.                           The duodenal bulb and second portion of the                            duodenum were normal. Complications:            No immediate complications. Estimated Blood Loss:     Estimated blood loss was minimal. Impression:               - LA Grade A reflux esophagitis.                           - Z-line variable, at the gastroesophageal                            junction. Biopsied.                           - A single gastric polyp. Biopsied.                           -  Medium-sized hiatal hernia.                           - Gastritis. Biopsied.                           - Normal duodenal bulb and second portion of the                            duodenum. Recommendation:           - Patient has a contact number available for  emergencies. The signs and symptoms of potential                            delayed complications were discussed with the                            patient. Return to normal activities tomorrow.                            Written discharge instructions were provided to the                            patient.                           - Resume previous diet.                           - Continue present medications.                           - Await pathology results.                           - Return to GI office in 2 months. Meryl Dare, MD 10/06/2017 11:15:19 AM This report has been signed electronically.

## 2017-10-06 NOTE — Patient Instructions (Signed)
**   Handouts given for Gastritis, Hiatal hernia, Gerd**    YOU HAD AN ENDOSCOPIC PROCEDURE TODAY AT THE [facility]:   Refer to the procedure report that was given to you for any specific questions about what was found during the examination.  If the procedure report does not answer your questions, please call your gastroenterologist to clarify.  If you requested that your care partner not be given the details of your procedure findings, then the procedure report has been included in a sealed envelope for you to review at your convenience later.  YOU SHOULD EXPECT: Some feelings of bloating in the abdomen. Passage of more gas than usual.  Walking can help get rid of the air that was put into your GI tract during the procedure and reduce the bloating. If you had a lower endoscopy (such as a colonoscopy or flexible sigmoidoscopy) you may notice spotting of blood in your stool or on the toilet paper. If you underwent a bowel prep for your procedure, you may not have a normal bowel movement for a few days.  Please Note:  You might notice some irritation and congestion in your nose or some drainage.  This is from the oxygen used during your procedure.  There is no need for concern and it should clear up in a day or so.  SYMPTOMS TO REPORT IMMEDIATELY:   Following upper endoscopy (EGD)  Vomiting of blood or coffee ground material  New chest pain or pain under the shoulder blades  Painful or persistently difficult swallowing  New shortness of breath  Fever of 100F or higher  Black, tarry-looking stools  For urgent or emergent issues, a gastroenterologist can be reached at any hour by calling [phone].   DIET:  We do recommend a small meal at first, but then you may proceed to your regular diet.  Drink plenty of fluids but you should avoid alcoholic beverages for 24 hours.  ACTIVITY:  You should plan to take it easy for the rest of today and you should NOT DRIVE or use heavy  machinery until tomorrow (because of the sedation medicines used during the test).    FOLLOW UP: Our staff will call the number listed on your records the next business day following your procedure to check on you and address any questions or concerns that you may have regarding the information given to you following your procedure. If we do not reach you, we will leave a message.  However, if you are feeling well and you are not experiencing any problems, there is no need to return our call.  We will assume that you have returned to your regular daily activities without incident.  If any biopsies were taken you will be contacted by phone or by letter within the next 1-3 weeks.  Please call us at [phone] if you have not heard about the biopsies in 3 weeks.    SIGNATURES/CONFIDENTIALITY: You and/or your care partner have signed paperwork which will be entered into your electronic medical record.  These signatures attest to the fact that that the information above on your After Visit Summary has been reviewed and is understood.  Full responsibility of the confidentiality of this discharge information lies with you and/or your care-partner.

## 2017-10-06 NOTE — Progress Notes (Signed)
Called to room to assist during endoscopic procedure.  Patient ID and intended procedure confirmed with present staff. Received instructions for my participation in the procedure from the performing physician.  

## 2017-10-07 ENCOUNTER — Telehealth: Payer: Self-pay

## 2017-10-07 NOTE — Telephone Encounter (Signed)
  Follow up Call-  Call back number 10/06/2017  Post procedure Call Back phone  # [phone]  Permission to leave phone message Yes     Patient questions:  Do you have a fever, pain , or abdominal swelling? No. Pain Score  0 *  Have you tolerated food without any problems? Yes.    Have you been able to return to your normal activities? Yes.    Do you have any questions about your discharge instructions: Diet   No. Medications  No. Follow up visit  No.  Do you have questions or concerns about your Care? No.  Actions: * If pain score is 4 or above: No action needed, pain <4.

## 2017-10-20 ENCOUNTER — Encounter: Payer: Self-pay | Admitting: Gastroenterology

## 2018-07-13 ENCOUNTER — Other Ambulatory Visit: Payer: Self-pay | Admitting: Gastroenterology

## 2018-09-15 ENCOUNTER — Other Ambulatory Visit: Payer: Self-pay | Admitting: Gastroenterology

## 2018-10-16 ENCOUNTER — Other Ambulatory Visit: Payer: Self-pay | Admitting: Gastroenterology

## 2018-11-16 ENCOUNTER — Other Ambulatory Visit: Payer: Self-pay | Admitting: Gastroenterology

## 2018-11-20 ENCOUNTER — Other Ambulatory Visit: Payer: Self-pay | Admitting: Gastroenterology

## 2018-11-20 ENCOUNTER — Telehealth: Payer: Self-pay | Admitting: Gastroenterology

## 2018-11-20 MED ORDER — PANTOPRAZOLE SODIUM 40 MG PO TBEC: 40.0000 mg | DELAYED_RELEASE_TABLET | Freq: Every day | ORAL | 0 refills | Status: AC

## 2018-11-20 NOTE — Telephone Encounter (Signed)
Prescription sent to patient's pharmacy.

## 2018-11-20 NOTE — Telephone Encounter (Signed)
Pt is sched for 11/28/2018 to see dr,stark

## 2018-11-28 ENCOUNTER — Ambulatory Visit: Payer: BC Managed Care – PPO | Admitting: Gastroenterology

## 2019-04-05 IMAGING — CT CT HEAD W/O CM
4 series · 17 of 47 positions shown, 19 images · non-contrast
Comparison: None.

CLINICAL DATA: Syncope

EXAM:
CT HEAD WITHOUT CONTRAST
TECHNIQUE: Contiguous axial images were obtained from the base of the skull
through the vertex without intravenous contrast.

[Series 3: head without · axial · non-contrast · 0.43mm/px · z∈[+1088,+1224]mm · 7 of 37 slices shown, 9 images]
[im 5/37  brain]
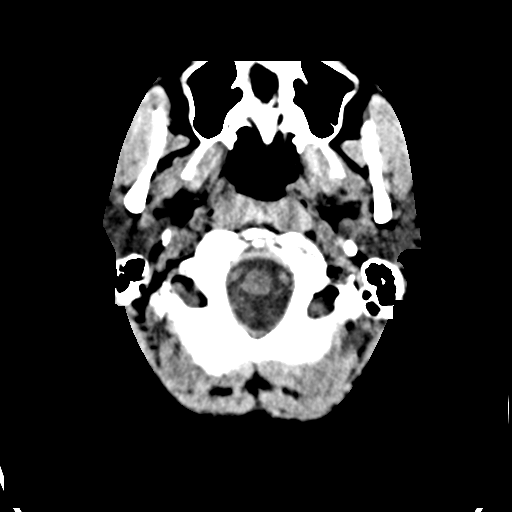
[im 5/37  bone]
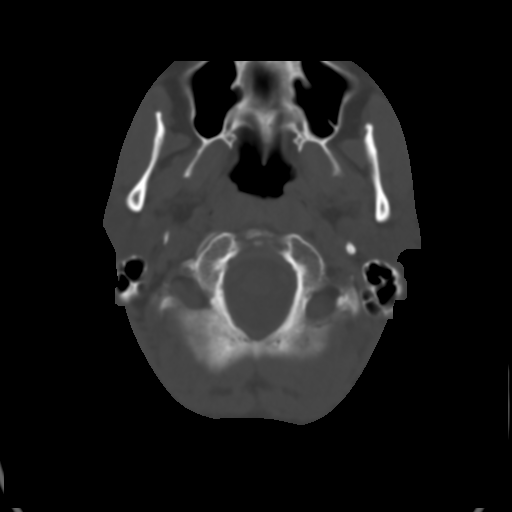
[im 10/37  brain]
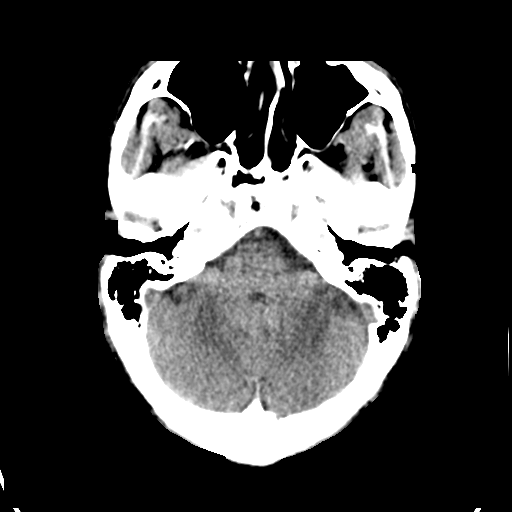
[im 14/37  brain]
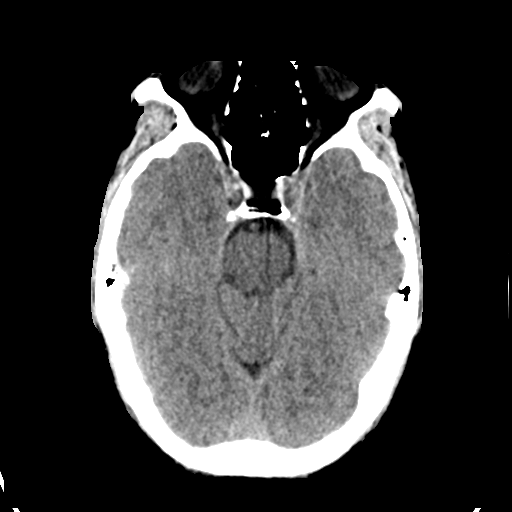
[im 19/37  brain]
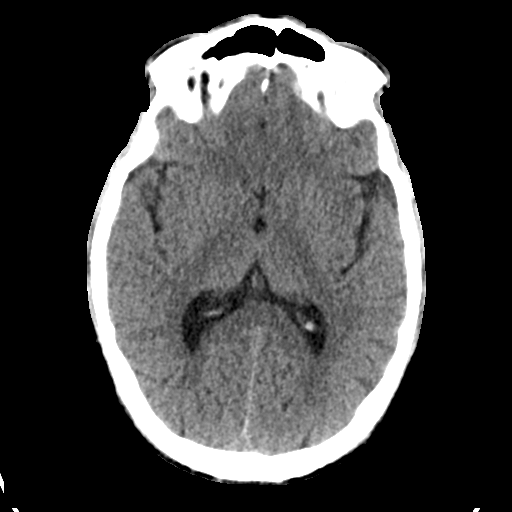
[im 23/37  brain]
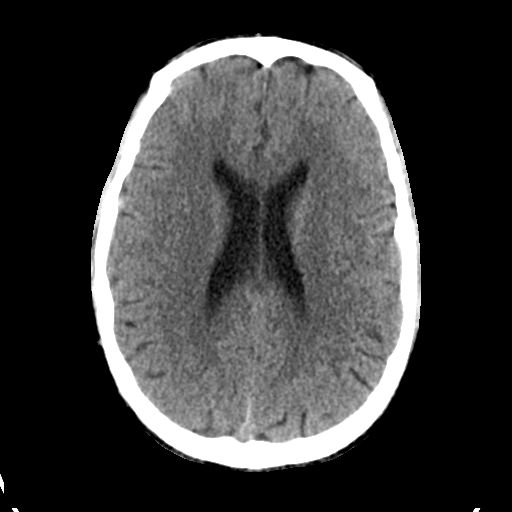
[im 23/37  bone]
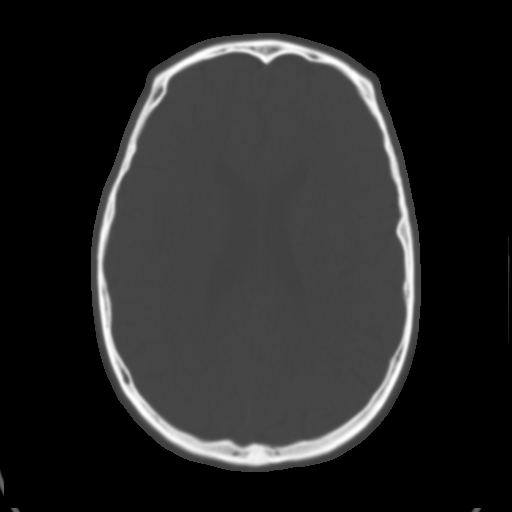
[im 28/37  brain]
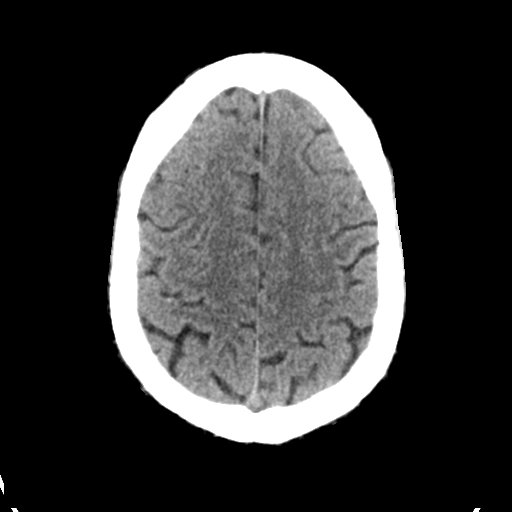
[im 32/37  brain]
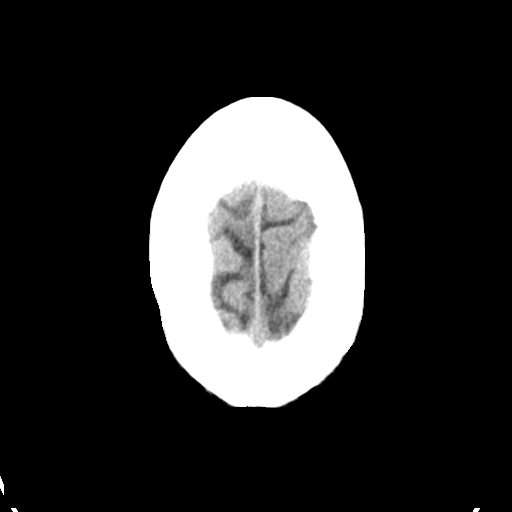

[Series 4: head bone · axial · 0.43mm/px · z∈[+1086,+1148]mm · 4 of 92 slices shown]
[im 10/92  bone]
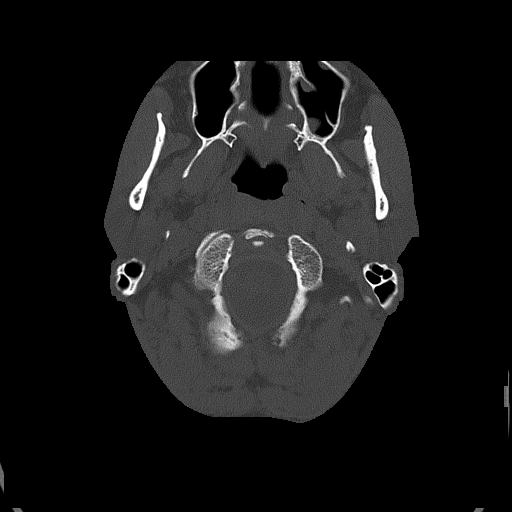
[im 19/92  bone]
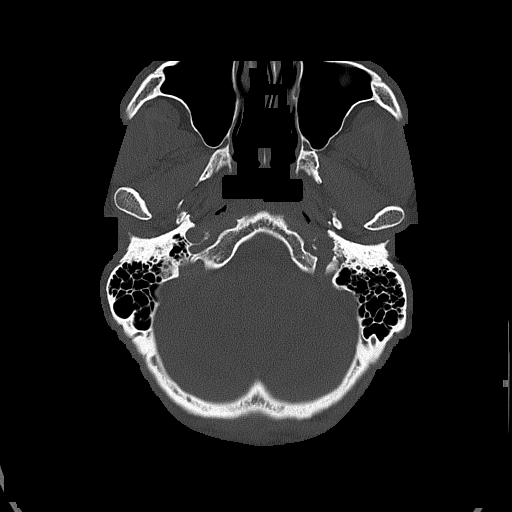
[im 28/92  bone]
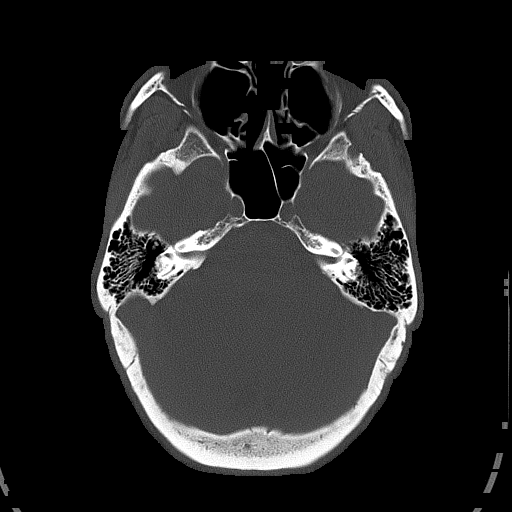
[im 41/92  bone]
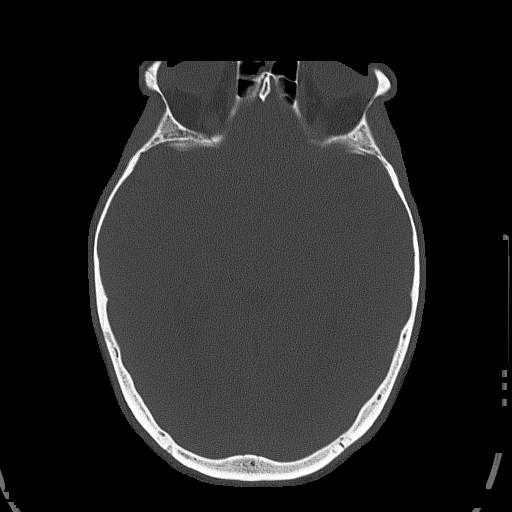

[Series 5: head without cor · coronal · non-contrast · 0.35mm/px · 3 of 70 slices shown]
[im 24/70  brain]
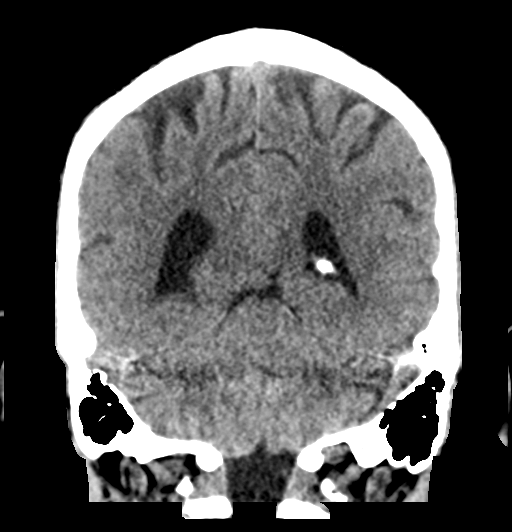
[im 31/70  brain]
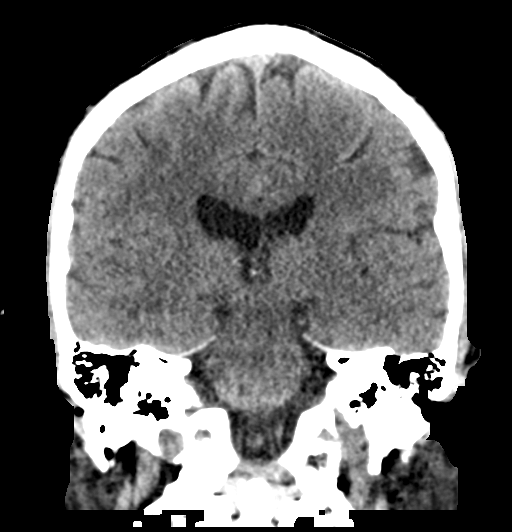
[im 39/70  brain]
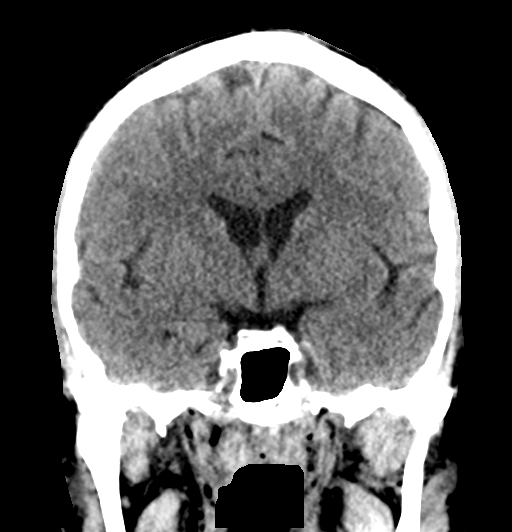

[Series 6: head without sag · sagittal · non-contrast · 0.36mm/px · 3 of 57 slices shown]
[im 19/57  brain]
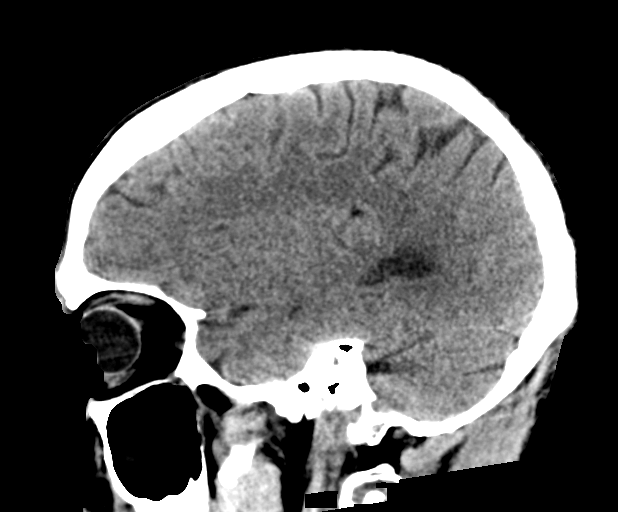
[im 29/57  brain]
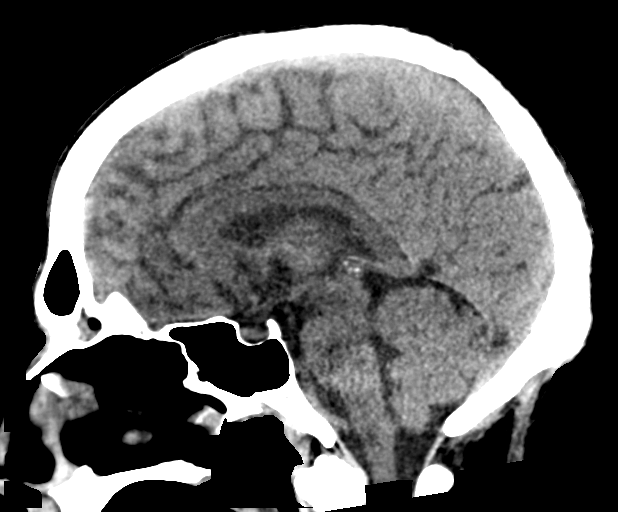
[im 38/57  brain]
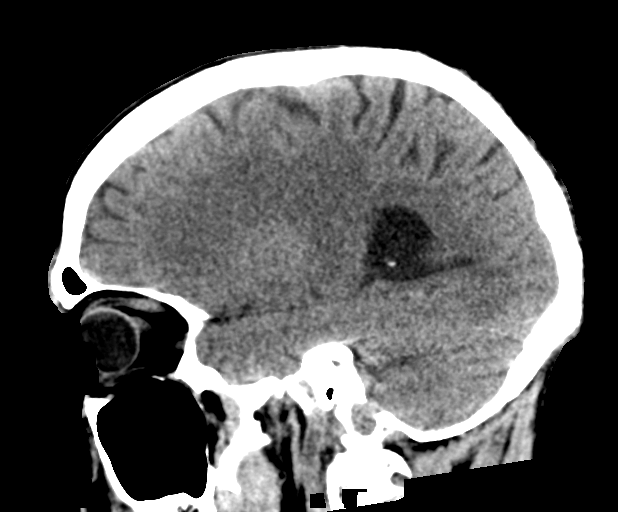

[17 of 47 positions shown; findings below may reference images not displayed]

FINDINGS: Brain: No mass lesion, intraparenchymal hemorrhage or extra-axial
collection. No evidence of acute cortical infarct. Brain parenchyma
and CSF-containing spaces are normal for age.

Vascular: No hyperdense vessel or unexpected calcification.

Skull: Normal visualized skull base, calvarium and extracranial soft
tissues.

Sinuses/Orbits: No sinus fluid levels or advanced mucosal
thickening. No mastoid effusion. Normal orbits.
IMPRESSION: Normal head CT.

## 2019-11-03 ENCOUNTER — Ambulatory Visit: Payer: BC Managed Care – PPO | Attending: Internal Medicine

## 2019-11-03 ENCOUNTER — Ambulatory Visit: Payer: Self-pay | Attending: Internal Medicine

## 2019-11-03 DIAGNOSIS — Z23 Encounter for immunization: Secondary | ICD-10-CM | POA: Insufficient documentation

## 2019-11-03 NOTE — Progress Notes (Signed)
   Covid-19 Vaccination Clinic  Name:  Izzac Rockett    MRN: [id number] DOB: 04/08/1971  11/03/2019  Mr. Schlereth was observed post Covid-19 immunization for 15 minutes without incidence. He was provided with Vaccine Information Sheet and instruction to access the V-Safe system.   Mr. Dillenbeck was instructed to call 911 with any severe reactions post vaccine: Marland Kitchen Difficulty breathing  . Swelling of your face and throat  . A fast heartbeat  . A bad rash all over your body  . Dizziness and weakness    Immunizations Administered    Name Date Dose VIS Date Route   Pfizer COVID-19 Vaccine 11/03/2019  5:38 PM 0.3 mL 08/17/2019 Intramuscular   Manufacturer: [employer], [employer]   Lot: [id number]   NDC: [address]-2

## 2019-11-24 ENCOUNTER — Ambulatory Visit: Payer: BC Managed Care – PPO | Attending: Internal Medicine

## 2019-11-24 DIAGNOSIS — Z23 Encounter for immunization: Secondary | ICD-10-CM

## 2019-11-24 NOTE — Progress Notes (Signed)
   Covid-19 Vaccination Clinic  Name:  Fernando Griffin    MRN: [id number] DOB: 1971/09/06  11/24/2019  Fernando Griffin was observed post Covid-19 immunization for 15 minutes without incident. He was provided with Vaccine Information Sheet and instruction to access the V-Safe system.   Fernando Griffin was instructed to call 911 with any severe reactions post vaccine: Marland Kitchen Difficulty breathing  . Swelling of face and throat  . A fast heartbeat  . A bad rash all over body  . Dizziness and weakness   Immunizations Administered    Name Date Dose VIS Date Route   Pfizer COVID-19 Vaccine 11/24/2019  9:02 AM 0.3 mL 08/17/2019 Intramuscular   Manufacturer: [employer], [employer]   Lot: [id number]   NDC: [address]-0

## 2019-12-03 ENCOUNTER — Other Ambulatory Visit: Payer: Self-pay | Admitting: Gastroenterology
